# Patient Record
Sex: Male | Born: 1960 | Race: White | Hispanic: No | Marital: Single
Health system: Midwestern US, Community
[De-identification: ages and names within clinical notes are randomized; demographics above are authoritative.]

## PROBLEM LIST (undated history)

## (undated) DIAGNOSIS — F5101 Primary insomnia: Secondary | ICD-10-CM

## (undated) DIAGNOSIS — F341 Dysthymic disorder: Secondary | ICD-10-CM

## (undated) DIAGNOSIS — E538 Deficiency of other specified B group vitamins: Secondary | ICD-10-CM

## (undated) DIAGNOSIS — R972 Elevated prostate specific antigen [PSA]: Secondary | ICD-10-CM

## (undated) DIAGNOSIS — Z125 Encounter for screening for malignant neoplasm of prostate: Secondary | ICD-10-CM

---

## 2017-09-21 ENCOUNTER — Ambulatory Visit: Admit: 2017-09-21 | Discharge: 2017-09-21 | Attending: Internal Medicine | Primary: Internal Medicine

## 2017-09-21 DIAGNOSIS — F419 Anxiety disorder, unspecified: Secondary | ICD-10-CM

## 2017-09-21 MED ORDER — FLUOXETINE 10 MG CAP
10 mg | ORAL_CAPSULE | ORAL | 2 refills | Status: DC
Start: 2017-09-21 — End: 2018-01-21

## 2017-09-21 MED ORDER — VARICELLA-ZOSTER GLYCOE VACC-AS01B ADJ(PF) 50 MCG/0.5 ML IM SUSPENSION
50 mcg/0.5 mL | Freq: Once | INTRAMUSCULAR | 1 refills | Status: AC
Start: 2017-09-21 — End: 2017-09-21

## 2017-09-21 NOTE — Patient Instructions (Addendum)
Patient instructions:   Contact numbers for our care team:   medical assistants- Synetta Failnita or [facility](Jen) and secretary-Terry   [phone] Fax [phone].  Stop buspirone.  Start fluoxetine for mood - start 1 capsule daily and after 7 days increase to 2 caps daily.  Look into counseling or therapy for further assistance with your mood - consider [facility], [facility], [facility].    Limit alcohol intake to 1 drink or less for females and 2 drinks or less for males daily.  A drink is defined as 12 oz of beer, 4 oz of wine or 2 oz of liquor.  Call if symptoms are not improving and call back or go to urgent care or emergency room for worsening symptoms.         Tdap (Tetanus, Diphtheria, Pertussis) Vaccine: What You Need to Know  Why get vaccinated?  Tetanus, diphtheria, and pertussis are very serious diseases. Tdap vaccine can protect us from these diseases. And Tdap vaccine given to pregnant women can protect newborn babies against pertussis.  Tetanus (lockjaw) is rare in the [place] today. It causes painful muscle tightening and stiffness, usually all over the body.  ?? It can lead to tightening of muscles in the head and neck so you can't open your mouth, swallow, or sometimes even breathe. Tetanus kills about 1 out of 10 people who are infected even after receiving the best medical care.  Diphtheria is also rare in the [place] today. It can cause a thick coating to form in the back of the throat.  ?? It can lead to breathing problems, heart failure, paralysis, and death.  Pertussis (whooping cough) causes severe coughing spells, which can cause difficulty breathing, vomiting, and disturbed sleep.  ?? It can also lead to weight loss, incontinence, and rib fractures. Up to 2 in 100 adolescents and 5 in 100 adults with pertussis are hospitalized or have complications, which could include pneumonia or death.   These diseases are caused by bacteria. Diphtheria and pertussis are spread from person to person through secretions from coughing or sneezing. Tetanus enters the body through cuts, scratches, or wounds.  Before vaccines, as many as 200,000 cases of diphtheria, 200,000 cases of pertussis, and hundreds of cases of tetanus were reported in the [place] each year. Since vaccination began, reports of cases for tetanus and diphtheria have dropped by about 99% and for pertussis by about 80%.  Tdap vaccine  The Tdap vaccine can protect adolescents and adults from tetanus, diphtheria, and pertussis. One dose of Tdap is routinely given at age 55 or 4012. People who did not get Tdap at that age should get it as soon as possible.  Tdap is especially important for health care professionals and anyone having close contact with a baby younger than 12 months.  Pregnant women should get a dose of Tdap during every pregnancy, to protect the newborn from pertussis. Infants are most at risk for severe, life-threatening complications from pertussis.  Another vaccine, called Td, protects against tetanus and diphtheria, but not pertussis. A Td booster should be given every 10 years. Tdap may be given as one of these boosters if you have never gotten Tdap before. Tdap may also be given after a severe cut or burn to prevent tetanus infection.  Your doctor or the person giving you the vaccine can give you more information.  Tdap may safely be given at the same time as other vaccines.  Some people should not get this vaccine  ?? A person who has ever had a life-threatening allergic reaction after a previous dose of any diphtheria-, tetanus-, or pertussis-containing vaccine, OR has a severe allergy to any part of this vaccine, should not get Tdap vaccine. Tell the person giving the vaccine about any severe allergies.  ?? Anyone who had coma or long repeated seizures within 7 days after a  childhood dose of DTP or DTaP, or a previous dose of Tdap, should not get Tdap, unless a cause other than the vaccine was found. They can still get Td.  ?? Talk to your doctor if you:  ? Have seizures or another nervous system problem.  ? Had severe pain or swelling after any vaccine containing diphtheria, tetanus, or pertussis.  ? Ever had a condition called Guillain-Barr?? Syndrome (GBS).  ? Aren't feeling well on the day the shot is scheduled.  Risks  With any medicine, including vaccines, there is a chance of side effects. These are usually mild and go away on their own. Serious reactions are also possible but are rare.  Most people who get Tdap vaccine do not have any problems with it.  Mild problems following Tdap  (Did not interfere with activities)  ?? Pain where the shot was given (about 3 in 4 adolescents or 2 in 3 adults)  ?? Redness or swelling where the shot was given (about 1 person in 5)  ?? Mild fever of at least 100.4??F (up to about 1 in 25 adolescents or 1 in 100 adults)  ?? Headache (about 3 or 4 people in 10)  ?? Tiredness (about 1 person in 3 or 4)  ?? Nausea, vomiting, diarrhea, stomachache (up to 1 in 4 adolescents or 1 in 10 adults)  ?? Chills, sore joints (about 1 person in 10)  ?? Body aches (about 1 person in 3 or 4)  ?? Rash, swollen glands (uncommon)  Moderate problems following Tdap  (Interfered with activities, but did not require medical attention)  ?? Pain where the shot was given (up to 1 in 5 or 6)  ?? Redness or swelling where the shot was given (up to about 1 in 16 adolescents or 1 in 12 adults)  ?? Fever over 102??F (about 1 in 100 adolescents or 1 in 250 adults)  ?? Headache (about 1 in 7 adolescents or 1 in 10 adults)  ?? Nausea, vomiting, diarrhea, stomachache (up to 1 to 3 people in 100)  ?? Swelling of the entire arm where the shot was given (up to about 1 in 500)  Severe problems following Tdap  (Unable to perform usual activities; required medical attention)   ?? Swelling, severe pain, bleeding and redness in the arm where the shot was given (rare)  Problems that could happen after any vaccine:  ?? People sometimes faint after a medical procedure, including vaccination. Sitting or lying down for about 15 minutes can help prevent fainting, and injuries caused by a fall. Tell your doctor if you feel dizzy or have vision changes or ringing in the ears.  ?? Some people get severe pain in the shoulder and have difficulty moving the arm where a shot was given. This happens very rarely.  ?? Any medication can cause a severe allergic reaction. Such reactions from a vaccine are very rare, estimated at fewer than 1 in a million doses, and would happen within a few minutes to a few hours after the vaccination.  As with any medicine, there  is a very remote chance of a vaccine causing a serious injury or death.  The safety of vaccines is always being monitored. For more information, visit: CreditChaos.com.ee.  What if there is a serious problem?  What should I look for?  ?? Look for anything that concerns you, such as signs of a severe allergic reaction, very high fever, or unusual behavior.  Signs of a severe allergic reaction can include hives, swelling of the face and throat, difficulty breathing, a fast heartbeat, dizziness, and weakness. These would usually start a few minutes to a few hours after the vaccination.  What should I do?  ?? If you think it is a severe allergic reaction or other emergency that can't wait, call 9-1-1 or get the person to the nearest hospital. Otherwise, call your doctor.  ?? Afterward, the reaction should be reported to the Vaccine Adverse Event Reporting System (VAERS). Your doctor might file this report, or you can do it yourself through the VAERS web site at www.vaers.LAgents.no, or by calling 1-586-685-4061.  VAERS does not give medical advice.  The National Vaccine Injury Compensation Program   The National Vaccine Injury Compensation Program (VICP) is a federal program that was created to compensate people who may have been injured by certain vaccines.  Persons who believe they may have been injured by a vaccine can learn about the program and about filing a claim by calling 1-251 845 1967 or visiting the VICP website at SpiritualWord.at. There is a time limit to file a claim for compensation.  How can I learn more?  ?? Ask your doctor. He or she can give you the vaccine package insert or suggest other sources of information.  ?? Call your local or state health department.  ?? Contact the Centers for Disease Control and Prevention (CDC):  ? Call (905)854-1860 (1-800-CDC-INFO) or  ? Visit CDC's website at PicCapture.uy  Vaccine Information Statement (Interim)  Tdap Vaccine  (06/03/13)  42 [place]. ?? [phone]  Department of Health and Insurance risk surveyor for Disease Control and Prevention  Many Vaccine Information Statements are available in Spanish and other languages. See PromoAge.com.br.  Muchas hojas de informaci??n sobre vacunas est??n disponibles en espa??ol y en otros idiomas. Visite PromoAge.com.br.  Care instructions adapted under license by [employer] (which disclaims liability or warranty for this information). If you have questions about a medical condition or this instruction, always ask your healthcare professional. Healthwise[employer] Incorporated disclaims any warranty or liability for your use of this information.
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
## 2017-10-22 NOTE — Telephone Encounter (Signed)
Spoke with pt to check and see how he is doing since his recent OV.    Asked pt how his mood has been,  pt states he is in a good mood. Pt states that he didn't need the fluoxetine after all he had taken it for a week and stopped, he is doing good without it. Pt states that he also did not want to take any drugs.    Asked pt if he found a therapist for counseling pt states that he called a couple of people and talked to them but he didn't go because he has gotten his head clear through exercise/working out.    Asked pt if he has been able to reduce his alcohol intake to help with mood and reduce risk to his liver and BP. Pt states that he has reduce alcohol.    Informed pt to let us know if he needs anything. Pt states he will be in touch.

## 2017-10-22 NOTE — Telephone Encounter (Signed)
Please call to check and see how patient is doing since his recent office visit.  Specifically ask the following:    1. How is his mood lately? Improved, same, worsened?  2. Is he tolerating fluoxetine well? Any refills needed?  3. Has he found a therapist for counseling?  4. Has he been able to reduce his alcohol intake to help with mood and reduce risk to his liver and BP?      Future Appointments   Date Time Provider Department Center   01/11/2018 10:40 AM Fredrik RiggerMcFarland, Timera Windt, MD [facility]

## 2017-12-24 MED ORDER — BUSPIRONE 15 MG TAB
15 mg | ORAL_TABLET | Freq: Two times a day (BID) | ORAL | 1 refills | Status: DC
Start: 2017-12-24 — End: 2019-06-26

## 2017-12-24 NOTE — Telephone Encounter (Signed)
Discussed request with the pt.  Pt did not like the way Prozac made him feel.  Pt has been exercising again and has his head on straight.  Pt prefers to go back to previous medication buspirone 15 mg.

## 2017-12-31 NOTE — Telephone Encounter (Signed)
I am happy to refer pt for screening colonoscopy if he would like. Is this what he is requesting? If so referral will be sent but still recommend f/u to discuss his other medical problems/follow up.

## 2017-12-31 NOTE — Telephone Encounter (Signed)
Pt walked in to the office. Pt would like a order for a screening colonoscopy. Also let pt know he has an appointment next week on Friday. Pt can be reached at [phone] if any questions.

## 2017-12-31 NOTE — Telephone Encounter (Signed)
Pt has an appt scheduled for 01/11/18.     Is requesting screening for colonoscopy,

## 2018-01-01 NOTE — Telephone Encounter (Signed)
Pt called back to confirm his appt for 01/11/18

## 2018-01-01 NOTE — Telephone Encounter (Signed)
LVM for pt to return call

## 2018-01-11 ENCOUNTER — Encounter: Attending: Internal Medicine | Primary: Internal Medicine

## 2018-01-21 ENCOUNTER — Ambulatory Visit: Admit: 2018-01-21 | Discharge: 2018-01-21 | Attending: Internal Medicine | Primary: Internal Medicine

## 2018-01-21 ENCOUNTER — Ambulatory Visit: Attending: Internal Medicine | Primary: Internal Medicine

## 2018-01-21 DIAGNOSIS — F419 Anxiety disorder, unspecified: Secondary | ICD-10-CM

## 2018-01-21 MED ORDER — SERTRALINE 50 MG TAB
50 mg | ORAL_TABLET | Freq: Every day | ORAL | 1 refills | Status: DC
Start: 2018-01-21 — End: 2018-01-31
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
## 2018-01-21 NOTE — Patient Instructions (Signed)
Patient instructions:   Contact numbers for our care team:   medical assistants- Phyllis Ginger or Jen  [phone] Fax [phone].  Start 1/2 tab of sertraline daily for mood and scratching habits. If improving consider reducing buspirone dosage if possible.  Call if symptoms are not improving and call back or go to urgent care or emergency room for worsening symptoms.
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
## 2018-01-31 MED ORDER — DOXEPIN 10 MG CAP
10 mg | ORAL_CAPSULE | ORAL | 3 refills | Status: DC
Start: 2018-01-31 — End: 2019-06-05

## 2018-01-31 NOTE — Telephone Encounter (Signed)
LVM for pt to return call

## 2018-01-31 NOTE — Telephone Encounter (Signed)
Ok if pt is doing well without medications for his mood this is good, then ok to use only something for sleep. I recommend doxepin low dosage for sleep. Most common side effect is sleepiness, others include possible dry mouth, constipation, dizziness but are not likely at low dosage used for sleep.  Rx sent to his pharmacy.      Please also remind patient that alcohol use is known to also to cause sleep disturbance.

## 2018-01-31 NOTE — Telephone Encounter (Signed)
Pt walked in to the office. Pt would like to "return a prescription" for sertraline (Zoloft) - pt is no longer taking it as it caused horrible diarrhea. The prescription also did not help the patient sleep. Pt had to pay $550.00 for a cologuard test that he cannot use until he is back to regular. Pt would like to know if there is a different medication that he can try instead of the sertraline. Pt is wondering if Alfonso Patten can be prescribed. Please call the pt at [phone].

## 2018-01-31 NOTE — Telephone Encounter (Signed)
Pt has been advised that we unfortunately cannot do anything about the Rx for sertraline.  We can try something else but pt declined.  He is happy with life right now.  He is just wanting to help sleep.

## 2018-02-05 NOTE — Telephone Encounter (Signed)
Pt states that the doxepin that was sent to his pharmacy on 01/31/18 is working great.     Pt was reminded that alcohol use is known to also to cause sleep disturbance.    Pt expressed understanding.

## 2018-02-19 ENCOUNTER — Encounter: Primary: Internal Medicine

## 2018-07-07 LAB — FECAL DNA COLORECTAL CANCER SCREENING (COLOGUARD): FIT-DNA (Cologuard): NEGATIVE

## 2019-06-05 ENCOUNTER — Ambulatory Visit: Admit: 2019-06-05 | Discharge: 2019-06-05 | Attending: Internal Medicine | Primary: Internal Medicine

## 2019-06-05 ENCOUNTER — Ambulatory Visit: Attending: Internal Medicine | Primary: Internal Medicine

## 2019-06-05 DIAGNOSIS — F5101 Primary insomnia: Secondary | ICD-10-CM

## 2019-06-05 MED ORDER — TRAZODONE 50 MG TAB
50 mg | ORAL_TABLET | Freq: Every evening | ORAL | 1 refills | Status: DC
Start: 2019-06-05 — End: 2019-06-26

## 2019-06-05 MED ORDER — SHINGRIX (PF) 50 MCG/0.5 ML INTRAMUSCULAR SUSPENSION, KIT
50 mcg/0.5 mL | Freq: Once | INTRAMUSCULAR | 1 refills | Status: AC
Start: 2019-06-05 — End: 2019-06-05

## 2019-06-05 MED ORDER — ESCITALOPRAM 5 MG TAB
5 mg | ORAL_TABLET | Freq: Every day | ORAL | 1 refills | Status: DC
Start: 2019-06-05 — End: 2019-06-26

## 2019-06-05 NOTE — Progress Notes (Signed)
I have reviewed all lab results which are normal or stable except PSA is quite elevated so recommend he take levaquin 500 mg daily for 14 days and then repeat PSA . Please inform the patient.

## 2019-06-05 NOTE — Progress Notes (Signed)
I called and notified the pt of these results/recommendations.  The pt verbalized understanding.  The pt would like to have the antibx sent to [facility] on [address]. He will call back once he has completed the antibx to schedule a lab appt.

## 2019-06-05 NOTE — Progress Notes (Signed)
[facility]  [facility]  Phillip Steele, M.D.  Internal Medicine  [address] [place] [address]  Office : [phone]  Fax : [phone]    Chief Complaint   Patient presents with   ??? Anxiety     This pt is a transfer from Dr. McFarland and is in for a ROV.  The pt states he would like to see about getting labs and to discuss some stress related issues.  He states he was given Doxepin, but it gives him weird dreams and makes him sweat.  The pt is not taking Buspar, but states he needs something for anxiety.        History of Present Illness:  Phillip Steele is a 59 y.o. male.  Anxiety  The history is provided by the patient. This is a chronic problem. The current episode started more than 1 week ago. The problem occurs constantly. The problem has not changed since onset.Pertinent negatives include no chest pain and no shortness of breath. The symptoms are aggravated by stress. Nothing relieves the symptoms. He has tried nothing for the symptoms. The treatment provided no relief.           Past Medical History:  Past Medical History:   Diagnosis Date   ??? Alcohol use 09/21/2017   ??? Anxiety 09/21/2017     Past Surgical History:  Past Surgical History:   Procedure Laterality Date   ??? HX CYST REMOVAL  ?2000    upper back     Allergies:   No Known Allergies  Medications:   Current Outpatient Medications   Medication Sig   ??? doxepin (SINEQUAN) 10 mg capsule Take 1 or 2 caps at bedtime for sleep (ideally 1 hour prior to bed)   ??? busPIRone (BUSPAR) 15 mg tablet Take 1 Tab by mouth two (2) times a day.     No current facility-administered medications for this visit.      Social History:  Social History     Tobacco Use   ??? Smoking status: Never Smoker   ??? Smokeless tobacco: Never Used   Substance Use Topics   ??? Alcohol use: Yes     Alcohol/week: 7.0 - 14.0 standard drinks     Types: 7 - 14 Glasses of wine per week     Frequency: 4 or more times a week     Drinks per session: 1 or 2      Comment: a week     Family History  Family History   Problem Relation Age of Onset   ??? Dementia Mother    ??? Other Father         unknown   ??? Thyroid Disease Sister        Review of Systems   Constitutional: Negative for chills and fever.   Respiratory: Negative for shortness of breath.    Cardiovascular: Negative for chest pain.   Musculoskeletal: Negative for falls.   Neurological: Negative for loss of consciousness.   Psychiatric/Behavioral: The patient is nervous/anxious and has insomnia.        Vital Signs  Visit Vitals  BP 137/88 (BP 1 Location: Right upper arm, BP Patient Position: Sitting, BP Cuff Size: Large adult)   Pulse 67   Temp 98.4 ??F (36.9 ??C) (Temporal)   Ht 5' 10" (1.778 m)   Wt 210 lb 12.8 oz (95.6 kg)   SpO2 99%   BMI 30.25 kg/m??       Body mass index is 30.25 kg/m??.    Physical Exam  Vitals signs reviewed.   Constitutional:       General: He is not in acute distress.     Appearance: Normal appearance. He is not ill-appearing.   HENT:      Head: Normocephalic and atraumatic.   Eyes:      General: No scleral icterus.     Extraocular Movements: Extraocular movements intact.      Pupils: Pupils are equal, round, and reactive to light.   Neck:      Vascular: No carotid bruit.   Cardiovascular:      Rate and Rhythm: Normal rate and regular rhythm.      Heart sounds: Normal heart sounds. No murmur.   Pulmonary:      Effort: Pulmonary effort is normal.      Breath sounds: Normal breath sounds.   Musculoskeletal:         General: No swelling.   Lymphadenopathy:      Cervical: No cervical adenopathy.   Skin:     Coloration: Skin is not jaundiced.   Neurological:      General: No focal deficit present.      Mental Status: He is alert. Mental status is at baseline.      Cranial Nerves: No cranial nerve deficit.      Motor: No weakness.      Gait: Gait normal.   Psychiatric:         Mood and Affect: Mood normal.         Behavior: Behavior normal.         Thought Content: Thought content normal.          Judgment: Judgment normal.           Assessment/Plan:    ICD-10-CM ICD-9-CM    1. Primary insomnia  F51.01 307.42 traZODone (DESYREL) 50 mg tablet   2. Anxiety  F41.9 300.00 escitalopram oxalate (LEXAPRO) 5 mg tablet      CBC WITH AUTOMATED DIFF      METABOLIC PANEL, BASIC   3. Screening for heart disease  Z13.6 V81.2 CT HEART W/O CONT WITH CALCIUM   4. Abnormal liver function  R94.5 794.8 LIPID PANEL      HEPATIC FUNCTION PANEL   5. Screening for prostate cancer  Z12.5 V76.44 PSA, DIAGNOSTIC (PROSTATE SPECIFIC AG)   6. Encounter for immunization  Z23 V03.89 varicella-zoster recombinant, PF, (Shingrix, PF,) 50 mcg/0.5 mL susr injection     Encounter Diagnoses   Name Primary?   ??? Primary insomnia Yes   ??? Anxiety    ??? Screening for heart disease    ??? Abnormal liver function    ??? Screening for prostate cancer    ??? Encounter for immunization      Diagnoses and all orders for this visit:    1. Primary insomnia  -     traZODone (DESYREL) 50 mg tablet; Take 1 Tab by mouth nightly.    2. Anxiety  -     escitalopram oxalate (LEXAPRO) 5 mg tablet; Take 1 Tab by mouth daily.  -     CBC WITH AUTOMATED DIFF  -     METABOLIC PANEL, BASIC    3. Screening for heart disease  -     CT HEART W/O CONT WITH CALCIUM; Future    4. Abnormal liver function  -     LIPID PANEL  -     HEPATIC FUNCTION PANEL  5. Screening for prostate cancer  -     PSA, DIAGNOSTIC (PROSTATE SPECIFIC AG)    6. Encounter for immunization  -     varicella-zoster recombinant, PF, (Shingrix, PF,) 50 mcg/0.5 mL susr injection; 0.5 mL by IntraMUSCular route once for 1 dose.      Follow-up and Dispositions    ?? Return in about 6 months (around 12/03/2019), or if symptoms worsen or fail to improve.           __  Margrett Rud, M.D.

## 2019-06-05 NOTE — Progress Notes (Signed)
[facility]  [facility]  Danelle Earthly, M.D.  Internal Medicine  [address]  [place], [place] [address]  Office : [phone]  Fax : [phone]    Chief Complaint   Patient presents with   ??? Anxiety     This pt is a transfer from Dr. Harriette Bouillon and is in for a ROV.  The pt states he would like to see about getting labs and to discuss some stress related issues.  He states he was given Doxepin, but it gives him weird dreams and makes him sweat.  The pt is not taking Buspar, but states he needs something for anxiety.        History of Present Illness:  Phillip Steele is a 59 y.o. male.  Anxiety  The history is provided by the patient. This is a chronic problem. The current episode started more than 1 week ago. The problem occurs constantly. The problem has not changed since onset.Pertinent negatives include no chest pain and no shortness of breath. The symptoms are aggravated by stress. Nothing relieves the symptoms. He has tried nothing for the symptoms. The treatment provided no relief.           Past Medical History:  Past Medical History:   Diagnosis Date   ??? Alcohol use 09/21/2017   ??? Anxiety 09/21/2017     Past Surgical History:  Past Surgical History:   Procedure Laterality Date   ??? HX CYST REMOVAL  ?2000    upper back     Allergies:   No Known Allergies  Medications:   Current Outpatient Medications   Medication Sig   ??? doxepin (SINEQUAN) 10 mg capsule Take 1 or 2 caps at bedtime for sleep (ideally 1 hour prior to bed)   ??? busPIRone (BUSPAR) 15 mg tablet Take 1 Tab by mouth two (2) times a day.     No current facility-administered medications for this visit.      Social History:  Social History     Tobacco Use   ??? Smoking status: Never Smoker   ??? Smokeless tobacco: Never Used   Substance Use Topics   ??? Alcohol use: Yes     Alcohol/week: 7.0 - 14.0 standard drinks     Types: 7 - 14 Glasses of wine per week     Frequency: 4 or more times a week     Drinks per session: 1 or 2      Comment: a week     Family History  Family History   Problem Relation Age of Onset   ??? Dementia Mother    ??? Other Father         unknown   ??? Thyroid Disease Sister        Review of Systems   Constitutional: Negative for chills and fever.   Respiratory: Negative for shortness of breath.    Cardiovascular: Negative for chest pain.   Musculoskeletal: Negative for falls.   Neurological: Negative for loss of consciousness.   Psychiatric/Behavioral: The patient is nervous/anxious and has insomnia.        Vital Signs  Visit Vitals  BP 137/88 (BP 1 Location: Right upper arm, BP Patient Position: Sitting, BP Cuff Size: Large adult)   Pulse 67   Temp 98.4 ??F (36.9 ??C) (Temporal)   Ht 5\' 10"  (1.778 m)   Wt 210 lb 12.8 oz (95.6 kg)   SpO2 99%   BMI 30.25 kg/m??  Body mass index is 30.25 kg/m??.    Physical Exam  Vitals signs reviewed.   Constitutional:       General: He is not in acute distress.     Appearance: Normal appearance. He is not ill-appearing.   HENT:      Head: Normocephalic and atraumatic.   Eyes:      General: No scleral icterus.     Extraocular Movements: Extraocular movements intact.      Pupils: Pupils are equal, round, and reactive to light.   Neck:      Vascular: No carotid bruit.   Cardiovascular:      Rate and Rhythm: Normal rate and regular rhythm.      Heart sounds: Normal heart sounds. No murmur.   Pulmonary:      Effort: Pulmonary effort is normal.      Breath sounds: Normal breath sounds.   Musculoskeletal:         General: No swelling.   Lymphadenopathy:      Cervical: No cervical adenopathy.   Skin:     Coloration: Skin is not jaundiced.   Neurological:      General: No focal deficit present.      Mental Status: He is alert. Mental status is at baseline.      Cranial Nerves: No cranial nerve deficit.      Motor: No weakness.      Gait: Gait normal.   Psychiatric:         Mood and Affect: Mood normal.         Behavior: Behavior normal.         Thought Content: Thought content normal.          Judgment: Judgment normal.           Assessment/Plan:    ICD-10-CM ICD-9-CM    1. Primary insomnia  F51.01 307.42 traZODone (DESYREL) 50 mg tablet   2. Anxiety  F41.9 300.00 escitalopram oxalate (LEXAPRO) 5 mg tablet      CBC WITH AUTOMATED DIFF      METABOLIC PANEL, BASIC   3. Screening for heart disease  Z13.6 V81.2 CT HEART W/O CONT WITH CALCIUM   4. Abnormal liver function  R94.5 794.8 LIPID PANEL      HEPATIC FUNCTION PANEL   5. Screening for prostate cancer  Z12.5 V76.44 PSA, DIAGNOSTIC (PROSTATE SPECIFIC AG)   6. Encounter for immunization  Z23 V03.89 varicella-zoster recombinant, PF, (Shingrix, PF,) 50 mcg/0.5 mL susr injection     Encounter Diagnoses   Name Primary?   ??? Primary insomnia Yes   ??? Anxiety    ??? Screening for heart disease    ??? Abnormal liver function    ??? Screening for prostate cancer    ??? Encounter for immunization      Diagnoses and all orders for this visit:    1. Primary insomnia  -     traZODone (DESYREL) 50 mg tablet; Take 1 Tab by mouth nightly.    2. Anxiety  -     escitalopram oxalate (LEXAPRO) 5 mg tablet; Take 1 Tab by mouth daily.  -     CBC WITH AUTOMATED DIFF  -     METABOLIC PANEL, BASIC    3. Screening for heart disease  -     CT HEART W/O CONT WITH CALCIUM; Future    4. Abnormal liver function  -     LIPID PANEL  -     HEPATIC FUNCTION PANEL  5. Screening for prostate cancer  -     PSA, DIAGNOSTIC (PROSTATE SPECIFIC AG)    6. Encounter for immunization  -     varicella-zoster recombinant, PF, (Shingrix, PF,) 50 mcg/0.5 mL susr injection; 0.5 mL by IntraMUSCular route once for 1 dose.      Follow-up and Dispositions    ?? Return in about 6 months (around 12/03/2019), or if symptoms worsen or fail to improve.           __  Margrett Rud, M.D.

## 2019-06-05 NOTE — Progress Notes (Signed)
I have reviewed all lab results which are normal or stable except PSA is quite elevated so recommend he take levaquin 500 mg daily for 14 days and then repeat PSA . Please inform the patient.

## 2019-06-05 NOTE — Progress Notes (Signed)
I called and notified the pt of these results/recommendations.  The pt verbalized understanding.  The pt would like to have the antibx sent to [facility] on [address]. He will call back once he has completed the antibx to schedule a lab appt.

## 2019-06-06 LAB — LIPID PANEL
Cholesterol, Total: 164 mg/dL (ref 100–199)
Cholesterol, total: 164 mg/dL (ref 100–199)
HDL Cholesterol: 56 mg/dL (ref >39)
HDL: 56 mg/dL (ref >39)
LDL Calculated: 95 mg/dL (ref 0–99)
LDL, calculated: 95 mg/dL (ref 0–99)
Triglyceride: 65 mg/dL (ref 0–149)
Triglycerides: 65 mg/dL (ref 0–149)
VLDL, calculated: 13 mg/dL (ref 5–40)
VLDL: 13 mg/dL (ref 5–40)

## 2019-06-06 LAB — HEPATIC FUNCTION PANEL
ALT (SGPT): 22 IU/L (ref 0–44)
ALT: 22 IU/L (ref 0–44)
AST (SGOT): 25 IU/L (ref 0–40)
AST: 25 IU/L (ref 0–40)
Albumin: 4.6 g/dL (ref 3.8–4.9)
Albumin: 4.6 g/dL (ref 3.8–4.9)
Alk. phosphatase: 58 IU/L (ref 39–117)
Alkaline Phosphatase: 58 IU/L (ref 39–117)
Bilirubin, Direct: 0.15 mg/dL (ref 0.00–0.40)
Bilirubin, direct: 0.15 mg/dL (ref 0.00–0.40)
Bilirubin, total: 0.5 mg/dL (ref 0.0–1.2)
Protein, total: 6.9 g/dL (ref 6.0–8.5)
Total Bilirubin: 0.5 mg/dL (ref 0.0–1.2)
Total Protein: 6.9 g/dL (ref 6.0–8.5)

## 2019-06-06 LAB — BASIC METABOLIC PANEL
BUN: 22 mg/dL (ref 6–24)
Bun/Cre Ratio: 22 NA — ABNORMAL HIGH (ref 9–20)
CO2: 23 mmol/L (ref 20–29)
Calcium: 9.5 mg/dL (ref 8.7–10.2)
Chloride: 104 mmol/L (ref 96–106)
Creatinine: 1 mg/dL (ref 0.76–1.27)
EGFR IF NonAfrican American: 83 mL/min/{1.73_m2} (ref >59)
GFR African American: 96 mL/min/{1.73_m2} (ref >59)
Glucose: 89 mg/dL (ref 65–99)
Potassium: 4.5 mmol/L (ref 3.5–5.2)
Sodium: 141 mmol/L (ref 134–144)

## 2019-06-06 LAB — CBC WITH AUTO DIFFERENTIAL
Basophils %: 2 %
Basophils Absolute: 0.1 10*3/uL (ref 0.0–0.2)
Eosinophils %: 2 %
Eosinophils Absolute: 0.1 10*3/uL (ref 0.0–0.4)
Granulocyte Absolute Count: 0 10*3/uL (ref 0.0–0.1)
Hematocrit: 43.1 % (ref 37.5–51.0)
Hemoglobin: 15.2 g/dL (ref 13.0–17.7)
Immature Granulocytes: 0 %
Lymphocytes %: 40 %
Lymphocytes Absolute: 2.2 10*3/uL (ref 0.7–3.1)
MCH: 33.1 pg — ABNORMAL HIGH (ref 26.6–33.0)
MCHC: 35.3 g/dL (ref 31.5–35.7)
MCV: 94 fL (ref 79–97)
Monocytes %: 14 %
Monocytes Absolute: 0.8 10*3/uL (ref 0.1–0.9)
Neutrophils %: 42 %
Neutrophils Absolute: 2.3 10*3/uL (ref 1.4–7.0)
Platelets: 263 10*3/uL (ref 150–450)
RBC: 4.59 x10E6/uL (ref 4.14–5.80)
RDW: 12.5 % (ref 11.6–15.4)
WBC: 5.4 10*3/uL (ref 3.4–10.8)

## 2019-06-06 LAB — PSA PROSTATIC SPECIFIC ANTIGEN: PSA: 10.8 ng/mL — ABNORMAL HIGH (ref 0.0–4.0)

## 2019-06-06 LAB — CBC WITH AUTOMATED DIFF
ABS. BASOPHILS: 0.1 10*3/uL (ref 0.0–0.2)
ABS. EOSINOPHILS: 0.1 10*3/uL (ref 0.0–0.4)
ABS. IMM. GRANS.: 0 10*3/uL (ref 0.0–0.1)
ABS. MONOCYTES: 0.8 10*3/uL (ref 0.1–0.9)
ABS. NEUTROPHILS: 2.3 10*3/uL (ref 1.4–7.0)
Abs Lymphocytes: 2.2 10*3/uL (ref 0.7–3.1)
BASOPHILS: 2 %
EOSINOPHILS: 2 %
HCT: 43.1 % (ref 37.5–51.0)
HGB: 15.2 g/dL (ref 13.0–17.7)
IMMATURE GRANULOCYTES: 0 %
Lymphocytes: 40 %
MCH: 33.1 pg — ABNORMAL HIGH (ref 26.6–33.0)
MCHC: 35.3 g/dL (ref 31.5–35.7)
MCV: 94 fL (ref 79–97)
MONOCYTES: 14 %
NEUTROPHILS: 42 %
PLATELET: 263 10*3/uL (ref 150–450)
RBC: 4.59 x10E6/uL (ref 4.14–5.80)
RDW: 12.5 % (ref 11.6–15.4)
WBC: 5.4 10*3/uL (ref 3.4–10.8)

## 2019-06-06 LAB — METABOLIC PANEL, BASIC
BUN/Creatinine ratio: 22 — ABNORMAL HIGH (ref 9–20)
BUN: 22 mg/dL (ref 6–24)
CO2: 23 mmol/L (ref 20–29)
Calcium: 9.5 mg/dL (ref 8.7–10.2)
Chloride: 104 mmol/L (ref 96–106)
Creatinine: 1 mg/dL (ref 0.76–1.27)
GFR est AA: 96 mL/min/{1.73_m2} (ref >59)
GFR est non-AA: 83 mL/min/{1.73_m2} (ref >59)
Glucose: 89 mg/dL (ref 65–99)
Potassium: 4.5 mmol/L (ref 3.5–5.2)
Sodium: 141 mmol/L (ref 134–144)

## 2019-06-06 LAB — PSA, DIAGNOSTIC (PROSTATE SPECIFIC AG): Prostate Specific Ag: 10.8 ng/mL — ABNORMAL HIGH (ref 0.0–4.0)

## 2019-06-10 MED ORDER — LEVOFLOXACIN 500 MG TAB
500 mg | ORAL_TABLET | Freq: Every day | ORAL | 0 refills | Status: DC
Start: 2019-06-10 — End: 2019-06-26

## 2019-06-10 NOTE — Telephone Encounter (Addendum)
-----   Message from Margrett Rud, MD sent at 06/10/2019  7:39 AM EST -----  I have reviewed all lab results which are normal or stable except PSA is quite elevated so recommend he take levaquin 500 mg daily for 14 days and then repeat PSA . Please inform the patient.    I called and notified the pt of these results/recommendations.  The pt verbalized understanding.  The pt would like to have the antibx sent to [facility] on [address]. He will call back once he has completed the antibx to schedule a lab appt.

## 2019-06-12 ENCOUNTER — Ambulatory Visit: Primary: Internal Medicine

## 2019-06-23 NOTE — Telephone Encounter (Signed)
The pt called stating he needs to set-up an appt for f/u after being placed on an antibx.  CB# [phone]    I called the pt and scheduled him for 06/26/19 at 1330.

## 2019-06-26 ENCOUNTER — Ambulatory Visit: Admit: 2019-06-26 | Discharge: 2019-06-26 | Attending: Internal Medicine | Primary: Internal Medicine

## 2019-06-26 ENCOUNTER — Inpatient Hospital Stay: Admit: 2019-06-26 | Payer: Self-pay | Attending: Internal Medicine | Primary: Internal Medicine

## 2019-06-26 ENCOUNTER — Ambulatory Visit: Attending: Internal Medicine | Primary: Internal Medicine

## 2019-06-26 DIAGNOSIS — Z136 Encounter for screening for cardiovascular disorders: Secondary | ICD-10-CM

## 2019-06-26 DIAGNOSIS — R972 Elevated prostate specific antigen [PSA]: Secondary | ICD-10-CM

## 2019-06-26 MED ORDER — TRAZODONE 50 MG TAB
50 mg | ORAL_TABLET | Freq: Every evening | ORAL | 1 refills | Status: AC
Start: 2019-06-26 — End: ?

## 2019-06-26 NOTE — Progress Notes (Signed)
The pt has an appt today at 1330.  We will discuss at his appt.

## 2019-06-26 NOTE — Progress Notes (Signed)
I called and notified the pt of these results/recommendations.  The pt verbalized understanding.

## 2019-06-26 NOTE — Progress Notes (Signed)
PSA has dropped significantly after antibiotic treatment.  Will recheck at his next visit to see if it continues to improve.  Please inform the patient

## 2019-06-26 NOTE — Progress Notes (Signed)
CT Calcium score report reviewed and is 0 which is normal.   Repeat in 5 years. Please inform the patient

## 2019-06-26 NOTE — Progress Notes (Signed)
[facility]  [facility]  Danelle Earthly, M.D.  Internal Medicine  [address]  [place], [place] [address]  Office : [phone]  Fax : [phone]    Chief Complaint   Patient presents with   ??? Abnormal Lab Results     The pt is in to f/u after an abnormal PSA.  He has now completed an antibx for this.  He is also in to go over his CT Calcium Score results.  The pt is no longer taking Lexpro, due to horrible side effects, and he is no longer on Buspar.  He would like these removed from his list.        History of Present Illness:  Phillip Steele is a 59 y.o. male.  HPI    Elevated PSA  Treated with levaquin for possible pro statis and he needs a follow up PSA.  He denies any trouble urinating or groin pain.  He did have decreased libido which has improved.  He attributes it to lexapro, but he also quit drinking alcohol    Insomnia  Trazodone helps with sleep      Pulmonary nodules  One is calcified.  The other is 2 mm and he never smoked or was exposed to tobacco, asbestos, or coal dust.  He is low risk for lung cancer so no follow up recommended per Fleischner society guidelines  CT Calcium score = 0.      Past Medical History:  Past Medical History:   Diagnosis Date   ??? Alcohol use 09/21/2017   ??? Anxiety 09/21/2017     Past Surgical History:  Past Surgical History:   Procedure Laterality Date   ??? HX CYST REMOVAL  ?2000    upper back     Allergies:   Allergies   Allergen Reactions   ??? Lexapro [Escitalopram] Other (comments)     SIDE EFFECTS     Medications:   Current Outpatient Medications   Medication Sig   ??? traZODone (DESYREL) 50 mg tablet Take 1 Tab by mouth nightly.     No current facility-administered medications for this visit.      Social History:  Social History     Tobacco Use   ??? Smoking status: Never Smoker   ??? Smokeless tobacco: Never Used   Substance Use Topics   ??? Alcohol use: Not Currently     Family History  Family History   Problem Relation Age of Onset   ??? Dementia  Mother    ??? Other Father         unknown   ??? Thyroid Disease Sister        Review of Systems   Constitutional: Negative for chills and fever.   Genitourinary: Negative for dysuria, frequency and urgency.   Psychiatric/Behavioral: The patient has insomnia.        Vital Signs  Visit Vitals  BP 122/80 (BP 1 Location: Right upper arm, BP Patient Position: Sitting, BP Cuff Size: Large adult)   Pulse 63   Temp 97.3 ??F (36.3 ??C) (Temporal)   Ht 5\' 10"  (1.778 m)   Wt 208 lb 3.2 oz (94.4 kg)   SpO2 98%   BMI 29.87 kg/m??     Body mass index is 29.87 kg/m??.    Physical Exam  Vitals signs reviewed.   Constitutional:       General: He is not in acute distress.     Appearance: Normal appearance. He is not ill-appearing.  HENT:      Head: Normocephalic and atraumatic.   Pulmonary:      Effort: Pulmonary effort is normal.   Genitourinary:     Prostate: Normal.      Rectum: Normal.   Musculoskeletal:         General: No swelling.   Skin:     Coloration: Skin is not jaundiced.      Findings: No rash.   Neurological:      General: No focal deficit present.      Mental Status: He is alert. Mental status is at baseline.      Cranial Nerves: No cranial nerve deficit.      Motor: No weakness.      Gait: Gait normal.   Psychiatric:         Mood and Affect: Mood normal.         Behavior: Behavior normal.         Thought Content: Thought content normal.         Judgment: Judgment normal.       Lab Results   Component Value Date/Time    Prostate Specific Ag 10.8 (H) 06/05/2019 04:02 PM         Assessment/Plan:    ICD-10-CM ICD-9-CM    1. Elevated PSA  R97.20 790.93 PSA, DIAGNOSTIC (PROSTATE SPECIFIC AG)   2. Primary insomnia  F51.01 307.42 traZODone (DESYREL) 50 mg tablet     Encounter Diagnoses   Name Primary?   ??? Elevated PSA Yes   ??? Primary insomnia      Diagnoses and all orders for this visit:    1. Elevated PSA  -     PSA, DIAGNOSTIC (PROSTATE SPECIFIC AG)    2. Primary insomnia  -     traZODone (DESYREL) 50 mg tablet; Take 1 Tab by  mouth nightly.      Follow-up and Dispositions    ?? Return if symptoms worsen or fail to improve.           __  Shinita Mac R. Veta Dambrosia, M.D.

## 2019-06-26 NOTE — Progress Notes (Signed)
CT Calcium score report reviewed and is 0 which is normal.   Repeat in 5 years. Please inform the patient.    Patient has been informed and voiced understanding.

## 2019-06-26 NOTE — Progress Notes (Signed)
[facility]  [facility]  Danelle Earthly, M.D.  Internal Medicine  [address]  [place], [place] [address]  Office : [phone]  Fax : [phone]    Chief Complaint   Patient presents with   ??? Abnormal Lab Results     The pt is in to f/u after an abnormal PSA.  He has now completed an antibx for this.  He is also in to go over his CT Calcium Score results.  The pt is no longer taking Lexpro, due to horrible side effects, and he is no longer on Buspar.  He would like these removed from his list.        History of Present Illness:  Phillip Steele is a 59 y.o. male.  HPI    Elevated PSA  Treated with levaquin for possible pro statis and he needs a follow up PSA.  He denies any trouble urinating or groin pain.  He did have decreased libido which has improved.  He attributes it to lexapro, but he also quit drinking alcohol    Insomnia  Trazodone helps with sleep      Pulmonary nodules  One is calcified.  The other is 2 mm and he never smoked or was exposed to tobacco, asbestos, or coal dust.  He is low risk for lung cancer so no follow up recommended per Fleischner society guidelines  CT Calcium score = 0.      Past Medical History:  Past Medical History:   Diagnosis Date   ??? Alcohol use 09/21/2017   ??? Anxiety 09/21/2017     Past Surgical History:  Past Surgical History:   Procedure Laterality Date   ??? HX CYST REMOVAL  ?2000    upper back     Allergies:   Allergies   Allergen Reactions   ??? Lexapro [Escitalopram] Other (comments)     SIDE EFFECTS     Medications:   Current Outpatient Medications   Medication Sig   ??? traZODone (DESYREL) 50 mg tablet Take 1 Tab by mouth nightly.     No current facility-administered medications for this visit.      Social History:  Social History     Tobacco Use   ??? Smoking status: Never Smoker   ??? Smokeless tobacco: Never Used   Substance Use Topics   ??? Alcohol use: Not Currently     Family History  Family History   Problem Relation Age of Onset   ??? Dementia  Mother    ??? Other Father         unknown   ??? Thyroid Disease Sister        Review of Systems   Constitutional: Negative for chills and fever.   Genitourinary: Negative for dysuria, frequency and urgency.   Psychiatric/Behavioral: The patient has insomnia.        Vital Signs  Visit Vitals  BP 122/80 (BP 1 Location: Right upper arm, BP Patient Position: Sitting, BP Cuff Size: Large adult)   Pulse 63   Temp 97.3 ??F (36.3 ??C) (Temporal)   Ht 5\' 10"  (1.778 m)   Wt 208 lb 3.2 oz (94.4 kg)   SpO2 98%   BMI 29.87 kg/m??     Body mass index is 29.87 kg/m??.    Physical Exam  Vitals signs reviewed.   Constitutional:       General: He is not in acute distress.     Appearance: Normal appearance. He is not ill-appearing.  HENT:      Head: Normocephalic and atraumatic.   Pulmonary:      Effort: Pulmonary effort is normal.   Genitourinary:     Prostate: Normal.      Rectum: Normal.   Musculoskeletal:         General: No swelling.   Skin:     Coloration: Skin is not jaundiced.      Findings: No rash.   Neurological:      General: No focal deficit present.      Mental Status: He is alert. Mental status is at baseline.      Cranial Nerves: No cranial nerve deficit.      Motor: No weakness.      Gait: Gait normal.   Psychiatric:         Mood and Affect: Mood normal.         Behavior: Behavior normal.         Thought Content: Thought content normal.         Judgment: Judgment normal.       Lab Results   Component Value Date/Time    Prostate Specific Ag 10.8 (H) 06/05/2019 04:02 PM         Assessment/Plan:    ICD-10-CM ICD-9-CM    1. Elevated PSA  R97.20 790.93 PSA, DIAGNOSTIC (PROSTATE SPECIFIC AG)   2. Primary insomnia  F51.01 307.42 traZODone (DESYREL) 50 mg tablet     Encounter Diagnoses   Name Primary?   ??? Elevated PSA Yes   ??? Primary insomnia      Diagnoses and all orders for this visit:    1. Elevated PSA  -     PSA, DIAGNOSTIC (PROSTATE SPECIFIC AG)    2. Primary insomnia  -     traZODone (DESYREL) 50 mg tablet; Take 1 Tab by  mouth nightly.      Follow-up and Dispositions    ?? Return if symptoms worsen or fail to improve.           __  Suan Halter, M.D.

## 2019-06-26 NOTE — Progress Notes (Signed)
CT Calcium score report reviewed and is 0 which is normal.   Repeat in 5 years. Please inform the patient

## 2019-06-26 NOTE — Progress Notes (Signed)
PSA has dropped significantly after antibiotic treatment.  Will recheck at his next visit to see if it continues to improve.  Please inform the patient

## 2019-06-26 NOTE — Progress Notes (Signed)
CT Calcium score report reviewed and is 0 which is normal.   Repeat in 5 years. Please inform the patient.    Patient has been informed and voiced understanding.

## 2019-06-26 NOTE — Progress Notes (Signed)
The pt has an appt today at 1330.  We will discuss at his appt.

## 2019-06-27 LAB — PSA PROSTATIC SPECIFIC ANTIGEN: PSA: 5.4 ng/mL — ABNORMAL HIGH (ref 0.0–4.0)

## 2019-06-27 LAB — PSA, DIAGNOSTIC (PROSTATE SPECIFIC AG): Prostate Specific Ag: 5.4 ng/mL — ABNORMAL HIGH (ref 0.0–4.0)

## 2019-08-01 ENCOUNTER — Encounter: Attending: Internal Medicine | Primary: Internal Medicine

## 2019-12-05 ENCOUNTER — Encounter: Attending: Internal Medicine | Primary: Internal Medicine

## 2020-07-19 ENCOUNTER — Encounter: Attending: Internal Medicine | Primary: Internal Medicine

## 2020-08-12 NOTE — Telephone Encounter (Signed)
 Received call from [place] at [facility] with [employer].    Subjective: Caller states He has been real tired lately     Current Symptoms: Last night was walking with a wide gate.  Hands are shaking.  Still walking with wide gate today.  Fatigue.  May have been with someone 1 month ago that may have a STD.  Hard to keep focus on conversations.  Suffers from depression.  Was sweating yesterday.  Slurred speech.  No appetite.     Onset: a few weeksworsening    Associated Symptoms: NA    Pain Severity:     Temperature: denies fever     What has been tried:     LMP: NA Pregnant: NA    Recommended disposition: Go to ED Now    Care advice provided, patient verbalizes understanding; denies any other questions or concerns; instructed to call back for any new or worsening symptoms.    Writer provided warm transfer to Jerica at [facility] for refusal of ED disposition    Attention Provider:  Thank you for allowing me to participate in the care of your patient.  The patient was connected to triage in response to information provided to the ECC.  Please do not respond through this encounter as the response is not directed to a shared pool.    Reason for Disposition  . Confusion, disorientation, or hallucinations is main symptom  . Very strange or paranoid behavior    Protocols used: NEUROLOGIC DEFICIT-ADULT-OH, CONFUSION - DELIRIUM-ADULT-OH

## 2020-08-12 NOTE — Telephone Encounter (Signed)
Pt neighbor Andreas Blower called and stated that he went to check  on pt has he normally does. And noticed that the pt was laying on the couch. He stated pt speech was slurred and that he was extremely tired, he also noticed pt hands was shaky and that it was hard for pt to focus on a conversation when being spoken to. I advised the neighbor to call 911 or take him to the nearest ER for evaluation

## 2020-08-13 ENCOUNTER — Emergency Department: Admit: 2020-08-13 | Primary: Internal Medicine

## 2020-08-13 ENCOUNTER — Inpatient Hospital Stay
Admit: 2020-08-13 | Discharge: 2020-08-13 | Disposition: A | Discharge status: Home / Self Care | Attending: Emergency Medicine

## 2020-08-13 DIAGNOSIS — F10229 Alcohol dependence with intoxication, unspecified: Secondary | ICD-10-CM

## 2020-08-13 LAB — EKG 12-LEAD
Atrial Rate: 102 {beats}/min
P Axis: 51 degrees
P-R Interval: 144 ms
Q-T Interval: 350 ms
QRS Duration: 90 ms
QTc Calculation (Bazett): 456 ms
R Axis: 63 degrees
T Axis: 46 degrees
Ventricular Rate: 102 {beats}/min

## 2020-08-13 LAB — URINALYSIS W/ RFLX MICROSCOPIC
Bilirubin, Urine: NEGATIVE
Bilirubin: NEGATIVE
Glucose, Ur: NEGATIVE mg/dL
Glucose: NEGATIVE mg/dL
Ketone: NEGATIVE mg/dL
Ketones, Urine: NEGATIVE mg/dL
Leukocyte Esterase, Urine: NEGATIVE
Leukocyte Esterase: NEGATIVE
Nitrite, Urine: NEGATIVE
Nitrites: NEGATIVE
Protein, UA: 30 mg/dL — AB
Protein: 30 mg/dL — AB
Specific Gravity, UA: 1.01 (ref 1.001–1.023)
Specific gravity: 1.01 (ref 1.001–1.023)
Urobilinogen, UA, POCT: 0.2 EU/dL (ref 0.2–1.0)
Urobilinogen: 0.2 EU/dL (ref 0.2–1.0)
pH (UA): 6 (ref 5.0–9.0)
pH, UA: 6 (ref 5.0–9.0)

## 2020-08-13 LAB — URINE MICROSCOPIC
BACTERIA, URINE: 0 /hpf
Bacteria: 0 /hpf
Crystals, UA: 0 /LPF
Crystals, urine: 0 /LPF
MUCUS, URINE: 0 /lpf
Mucus: 0 /lpf

## 2020-08-13 LAB — DRUG SCREEN, URINE
AMPHETAMINES: NEGATIVE
Amphetamine Screen, Urine: NEGATIVE
BARBITURATES: NEGATIVE
BENZODIAZEPINES: NEGATIVE
Barbiturate Screen, Urine: NEGATIVE
Benzodiazepine Screen, Urine: NEGATIVE
COCAINE: NEGATIVE
Cocaine Screen Urine: NEGATIVE
METHADONE: NEGATIVE
Methadone Screen, Urine: NEGATIVE
OPIATES: NEGATIVE
Opiate Screen, Urine: NEGATIVE
PCP Screen, Urine: NEGATIVE
PCP(PHENCYCLIDINE): NEGATIVE
THC (TH-CANNABINOL): NEGATIVE
THC Screen, Urine: NEGATIVE

## 2020-08-13 LAB — CBC WITH AUTO DIFFERENTIAL
Basophils %: 3 % — ABNORMAL HIGH (ref 0.0–2.0)
Basophils Absolute: 0.1 10*3/uL (ref 0.0–0.2)
Eosinophils %: 0 % — ABNORMAL LOW (ref 0.5–7.8)
Eosinophils Absolute: 0 10*3/uL (ref 0.0–0.8)
Granulocyte Absolute Count: 0 10*3/uL (ref 0.0–0.5)
Hematocrit: 48.2 % (ref 41.1–50.3)
Hemoglobin: 16.7 g/dL (ref 13.6–17.2)
Immature Granulocytes: 1 % (ref 0.0–5.0)
Lymphocytes %: 53 % — ABNORMAL HIGH (ref 13–44)
Lymphocytes Absolute: 2 10*3/uL (ref 0.5–4.6)
MCH: 32.2 PG (ref 26.1–32.9)
MCHC: 34.6 g/dL (ref 31.4–35.0)
MCV: 93.1 FL (ref 79.6–97.8)
MPV: 8.6 FL — ABNORMAL LOW (ref 9.4–12.3)
Monocytes %: 19 % — ABNORMAL HIGH (ref 4.0–12.0)
Monocytes Absolute: 0.7 10*3/uL (ref 0.1–1.3)
NRBC Absolute: 0 10*3/uL (ref 0.0–0.2)
Neutrophils %: 23 % — ABNORMAL LOW (ref 43–78)
Neutrophils Absolute: 0.9 10*3/uL — ABNORMAL LOW (ref 1.7–8.2)
Platelets: 212 10*3/uL (ref 150–450)
RBC: 5.18 M/uL (ref 4.23–5.6)
RDW: 13.2 % (ref 11.9–14.6)
WBC: 3.8 10*3/uL — ABNORMAL LOW (ref 4.3–11.1)

## 2020-08-13 LAB — COMPREHENSIVE METABOLIC PANEL
ALT: 232 U/L — ABNORMAL HIGH (ref 12–65)
AST: 272 U/L — ABNORMAL HIGH (ref 15–37)
Albumin/Globulin Ratio: 1.2 (ref 1.2–3.5)
Albumin: 4.8 g/dL (ref 3.5–5.0)
Alkaline Phosphatase: 71 U/L (ref 50–136)
Anion Gap: 14 mmol/L (ref 7–16)
BUN: 10 MG/DL (ref 6–23)
CO2: 22 mmol/L (ref 21–32)
Calcium: 8.9 MG/DL (ref 8.3–10.4)
Chloride: 102 mmol/L (ref 98–107)
Creatinine: 1 MG/DL (ref 0.8–1.5)
EGFR IF NonAfrican American: >60 mL/min/{1.73_m2} (ref >60)
GFR African American: >60 mL/min/{1.73_m2} (ref >60)
Globulin: 3.9 g/dL — ABNORMAL HIGH (ref 2.3–3.5)
Glucose: 91 mg/dL (ref 65–100)
Potassium: 5.8 mmol/L — ABNORMAL HIGH (ref 3.5–5.1)
Sodium: 138 mmol/L (ref 138–145)
Total Bilirubin: 1 MG/DL (ref 0.2–1.1)
Total Protein: 8.7 g/dL — ABNORMAL HIGH (ref 6.3–8.2)

## 2020-08-13 LAB — TSH 3RD GENERATION
TSH: 2 u[IU]/mL (ref 0.358–3.740)
TSH: 2 u[IU]/mL (ref 0.358–3.740)

## 2020-08-13 LAB — MAGNESIUM
Magnesium: 2.4 mg/dL (ref 1.8–2.4)
Magnesium: 2.4 mg/dL (ref 1.8–2.4)

## 2020-08-13 LAB — ETHYL ALCOHOL
ALCOHOL(ETHYL),SERUM: 326 MG/DL
Ethyl Alcohol: 326 MG/DL

## 2020-08-13 LAB — METABOLIC PANEL, COMPREHENSIVE
A-G Ratio: 1.2 (ref 1.2–3.5)
ALT (SGPT): 232 U/L — ABNORMAL HIGH (ref 12–65)
AST (SGOT): 272 U/L — ABNORMAL HIGH (ref 15–37)
Albumin: 4.8 g/dL (ref 3.5–5.0)
Alk. phosphatase: 71 U/L (ref 50–136)
Anion gap: 14 mmol/L (ref 7–16)
BUN: 10 MG/DL (ref 6–23)
Bilirubin, total: 1 MG/DL (ref 0.2–1.1)
CO2: 22 mmol/L (ref 21–32)
Calcium: 8.9 MG/DL (ref 8.3–10.4)
Chloride: 102 mmol/L (ref 98–107)
Creatinine: 1 MG/DL (ref 0.8–1.5)
GFR est AA: >60 mL/min/{1.73_m2} (ref >60)
GFR est non-AA: >60 mL/min/{1.73_m2} (ref >60)
Globulin: 3.9 g/dL — ABNORMAL HIGH (ref 2.3–3.5)
Glucose: 91 mg/dL (ref 65–100)
Potassium: 5.8 mmol/L — ABNORMAL HIGH (ref 3.5–5.1)
Protein, total: 8.7 g/dL — ABNORMAL HIGH (ref 6.3–8.2)
Sodium: 138 mmol/L (ref 138–145)

## 2020-08-13 LAB — CBC WITH AUTOMATED DIFF
ABS. BASOPHILS: 0.1 10*3/uL (ref 0.0–0.2)
ABS. EOSINOPHILS: 0 10*3/uL (ref 0.0–0.8)
ABS. IMM. GRANS.: 0 10*3/uL (ref 0.0–0.5)
ABS. LYMPHOCYTES: 2 10*3/uL (ref 0.5–4.6)
ABS. MONOCYTES: 0.7 10*3/uL (ref 0.1–1.3)
ABS. NEUTROPHILS: 0.9 10*3/uL — ABNORMAL LOW (ref 1.7–8.2)
ABSOLUTE NRBC: 0 10*3/uL (ref 0.0–0.2)
BASOPHILS: 3 % — ABNORMAL HIGH (ref 0.0–2.0)
EOSINOPHILS: 0 % — ABNORMAL LOW (ref 0.5–7.8)
HCT: 48.2 % (ref 41.1–50.3)
HGB: 16.7 g/dL (ref 13.6–17.2)
IMMATURE GRANULOCYTES: 1 % (ref 0.0–5.0)
LYMPHOCYTES: 53 % — ABNORMAL HIGH (ref 13–44)
MCH: 32.2 PG (ref 26.1–32.9)
MCHC: 34.6 g/dL (ref 31.4–35.0)
MCV: 93.1 FL (ref 79.6–97.8)
MONOCYTES: 19 % — ABNORMAL HIGH (ref 4.0–12.0)
MPV: 8.6 FL — ABNORMAL LOW (ref 9.4–12.3)
NEUTROPHILS: 23 % — ABNORMAL LOW (ref 43–78)
PLATELET: 212 10*3/uL (ref 150–450)
RBC: 5.18 M/uL (ref 4.23–5.6)
RDW: 13.2 % (ref 11.9–14.6)
WBC: 3.8 10*3/uL — ABNORMAL LOW (ref 4.3–11.1)

## 2020-08-13 LAB — EKG, 12 LEAD, INITIAL
Atrial Rate: 102 {beats}/min
Calculated P Axis: 51 degrees
Calculated R Axis: 63 degrees
Calculated T Axis: 46 degrees
P-R Interval: 144 ms
Q-T Interval: 350 ms
QRS Duration: 90 ms
QTC Calculation (Bezet): 456 ms
Ventricular Rate: 102 {beats}/min

## 2020-08-13 MED ORDER — CHLORDIAZEPOXIDE 25 MG CAP
25 mg | ORAL_CAPSULE | Freq: Three times a day (TID) | ORAL | 0 refills | Status: AC | PRN
Start: 2020-08-13 — End: ?

## 2020-08-13 MED ORDER — THIAMINE 100 MG/ML INJECTION
100 mg/mL | INTRAMUSCULAR | Status: AC
Start: 2020-08-13 — End: 2020-08-13
  Administered 2020-08-13: 15:00:00 via INTRAVENOUS

## 2020-08-13 MED ORDER — THIAMINE HCL 100 MG TAB
100 mg | ORAL_TABLET | Freq: Every day | ORAL | 0 refills | Status: AC
Start: 2020-08-13 — End: ?

## 2020-08-13 MED ORDER — MAGNESIUM OXIDE 400 MG TAB
400 mg | ORAL_TABLET | Freq: Every day | ORAL | 0 refills | Status: AC
Start: 2020-08-13 — End: ?

## 2020-08-13 MED ORDER — LORAZEPAM 2 MG/ML IJ SOLN
2 mg/mL | INTRAMUSCULAR | Status: AC
Start: 2020-08-13 — End: 2020-08-13
  Administered 2020-08-13: 15:00:00 via INTRAVENOUS

## 2020-08-13 MED FILL — THIAMINE 100 MG/ML INJECTION: 100 mg/mL | INTRAMUSCULAR | Qty: 2

## 2020-08-13 MED FILL — LORAZEPAM 2 MG/ML IJ SOLN: 2 mg/mL | INTRAMUSCULAR | Qty: 1
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
## 2020-08-13 NOTE — ED Notes (Signed)
Report given to Jordan RN, transfer of care at this time.

## 2020-08-13 NOTE — ED Notes (Signed)
 I have reviewed discharge instructions with the patient.  The patient verbalized understanding.    Patient left ED via Discharge Method: ambulatory to Home with friend.    Opportunity for questions and clarification provided.       Patient given 3 scripts.         To continue your aftercare when you leave the hospital, you may receive an automated call from our care team to check in on how you are doing.  This is a free service and part of our promise to provide the best care and service to meet your aftercare needs." If you have questions, or wish to unsubscribe from this service please call [phone].  Thank you for Choosing our [facility] Emergency Department.

## 2020-08-13 NOTE — ED Notes (Signed)
 Pt arrives via pov with a friend. Pt reports he Is very depressed. Pt reports he uses ETOH to self medicate. Pt reports he drinks 6 + beers per day. Pt is very talkative in triage reporting he loves to drink and last night he was drunk. Pt reports over last 2-3 weeks he has been more wobbly with walking. Denies SI/HI.

## 2020-08-18 ENCOUNTER — Inpatient Hospital Stay
Admit: 2020-08-18 | Discharge: 2020-08-18 | Disposition: A | Discharge status: Home / Self Care | Attending: Emergency Medicine

## 2020-08-18 DIAGNOSIS — F10920 Alcohol use, unspecified with intoxication, uncomplicated: Secondary | ICD-10-CM

## 2020-08-18 NOTE — ED Notes (Signed)

## 2020-08-18 NOTE — ED Notes (Signed)
Pt arrived to triage with mask in place. Pt c/o anxiety. Pt states it makes him feel "horrible". Pt given rx for anxiety at ER visit last week.

## 2020-08-18 NOTE — ED Provider Notes (Signed)
Phillip Steele is a 60 year old male with a known history of alcoholism presenting to the emergency department today complaining of feeling disequilibrium.  Patient states he has been drinking this morning.  He also had alcohol last night.  Patient says he normally bottle of wine or more daily.  The patient has gone through detox twice in the last 18 months.  Patient was seen and evaluated here last week and had a CT brain and blood work done.  Patient denies having any new symptoms and says that the Librium he was given did not seem to work so he went back to drinking.           Past Medical History:   Diagnosis Date   ??? Alcohol use 09/21/2017   ??? Anxiety 09/21/2017       Past Surgical History:   Procedure Laterality Date   ??? HX CYST REMOVAL  ?2000    upper back         Family History:   Problem Relation Age of Onset   ??? Dementia Mother    ??? Other Father         unknown   ??? Thyroid Disease Sister        Social History     Socioeconomic History   ??? Marital status: SINGLE     Spouse name: Not on file   ??? Number of children: Not on file   ??? Years of education: Not on file   ??? Highest education level: Not on file   Occupational History   ??? Occupation: Airline pilot- [employer]   Tobacco Use   ??? Smoking status: Never Smoker   ??? Smokeless tobacco: Never Used   Vaping Use   ??? Vaping Use: Never used   Substance and Sexual Activity   ??? Alcohol use: Yes     Comment: 6 beers per day    ??? Drug use: Not Currently   ??? Sexual activity: Not on file   Other Topics Concern   ??? Not on file   Social History Narrative    Divorced - lives alone     Social Determinants of Health     Financial Resource Strain:    ??? Difficulty of Paying Living Expenses: Not on file   Food Insecurity:    ??? Worried About Programme researcher, broadcasting/film/video in the Last Year: Not on file   ??? Ran Out of Food in the Last Year: Not on file   Transportation Needs:    ??? Freight forwarder (Medical): Not on file   ??? Lack of Transportation (Non-Medical): Not on file   Physical Activity:     ??? Days of Exercise per Week: Not on file   ??? Minutes of Exercise per Session: Not on file   Stress:    ??? Feeling of Stress : Not on file   Social Connections:    ??? Frequency of Communication with Friends and Family: Not on file   ??? Frequency of Social Gatherings with Friends and Family: Not on file   ??? Attends Religious Services: Not on file   ??? Active Member of Clubs or Organizations: Not on file   ??? Attends Banker Meetings: Not on file   ??? Marital Status: Not on file   Intimate Partner Violence:    ??? Fear of Current or Ex-Partner: Not on file   ??? Emotionally Abused: Not on file   ??? Physically Abused: Not on file   ??? Sexually Abused:  Not on file   Housing Stability:    ??? Unable to Pay for Housing in the Last Year: Not on file   ??? Number of Places Lived in the Last Year: Not on file   ??? Unstable Housing in the Last Year: Not on file         ALLERGIES: Lexapro [escitalopram]    Review of Systems   Psychiatric/Behavioral:        Intoxication   All other systems reviewed and are negative.      Vitals:    08/18/20 1139   BP: (!) 132/112   Pulse: 98   Resp: 14   Temp: 98.2 ??F (36.8 ??C)   SpO2: 98%   Weight: 86.2 kg (190 lb)   Height: 5\' 10"  (1.778 m)            Physical Exam     GENERAL:The patient has Body mass index is 27.26 kg/m??. Well-hydrated.  No acute distress.  VITAL SIGNS: Heart rate, blood pressure, respiratory rate reviewed as recorded in  nurse's notes  EYES: Pupils reactive. Extraocular motion intact. No conjunctival redness or drainage.  LUNGS: No accessory muscle use  CARDIOVASCULAR: Regular rate and rhythm  EXTREMITIES: Pt moving all 4 extremities with out limitations. Normal muscle tone.  NEUROLOGIC: Cranial nerve exam reveals face is symmetrical, tongue is midline  speech is clear. No focal deficits noted  SKIN: No rash or petechiae. Good skin turgor palpated.  PSYCHIATRIC: Alert and oriented.  Patient intoxicated but functional.      MDM  Number of Diagnoses or Management  Options  Alcoholic intoxication without complication (HCC)  Alcoholism (HCC)  Diagnosis management comments: Alcohol intoxication, alcohol withdrawal, drug addiction, drug abuse, polysubstance abuse,    Depression, electrolyte abnormality, malnutrition, alcoholic ketoacidosis, Wernickes  encephalopathy, vitamin deficiency, stimulant-induced psychosis         Amount and/or Complexity of Data Reviewed  Review and summarize past medical records: yes           Procedures

## 2020-08-25 ENCOUNTER — Emergency Department (HOSPITAL_COMMUNITY)
Admission: EM | Admit: 2020-08-25 | Discharge: 2020-08-25 | Disposition: A | Discharge status: Home / Self Care | Payer: Self-pay | Attending: Emergency Medicine | Admitting: Emergency Medicine

## 2020-08-25 ENCOUNTER — Emergency Department (HOSPITAL_COMMUNITY): Payer: Self-pay

## 2020-08-25 DIAGNOSIS — Y92410 Unspecified street and highway as the place of occurrence of the external cause: Secondary | ICD-10-CM | POA: Insufficient documentation

## 2020-08-25 DIAGNOSIS — S0083XA Contusion of other part of head, initial encounter: Secondary | ICD-10-CM | POA: Insufficient documentation

## 2020-08-25 DIAGNOSIS — Z20822 Contact with and (suspected) exposure to covid-19: Secondary | ICD-10-CM | POA: Insufficient documentation

## 2020-08-25 DIAGNOSIS — I959 Hypotension, unspecified: Secondary | ICD-10-CM | POA: Insufficient documentation

## 2020-08-25 DIAGNOSIS — T1490XA Injury, unspecified, initial encounter: Secondary | ICD-10-CM

## 2020-08-25 LAB — COMPREHENSIVE METABOLIC PANEL
ALT: 245 U/L — ABNORMAL HIGH (ref 0–44)
AST: 303 U/L — ABNORMAL HIGH (ref 15–41)
Albumin: 4.2 g/dL (ref 3.5–5.0)
Alkaline Phosphatase: 69 U/L (ref 38–126)
Anion gap: 17 — ABNORMAL HIGH (ref 5–15)
BUN: 19 mg/dL (ref 6–20)
CO2: 19 mmol/L — ABNORMAL LOW (ref 22–32)
Calcium: 9.7 mg/dL (ref 8.9–10.3)
Chloride: 101 mmol/L (ref 98–111)
Creatinine, Ser: 1.07 mg/dL (ref 0.61–1.24)
GFR, Estimated: >60 mL/min (ref >60)
Glucose, Bld: 165 mg/dL — ABNORMAL HIGH (ref 70–99)
Potassium: 3.8 mmol/L (ref 3.5–5.1)
Sodium: 137 mmol/L (ref 135–145)
Total Bilirubin: 2.1 mg/dL — ABNORMAL HIGH (ref 0.3–1.2)
Total Protein: 6.7 g/dL (ref 6.5–8.1)

## 2020-08-25 LAB — URINALYSIS, ROUTINE W REFLEX MICROSCOPIC
Bilirubin Urine: NEGATIVE
Glucose, UA: NEGATIVE mg/dL
Hgb urine dipstick: NEGATIVE
Ketones, ur: 5 mg/dL — AB
Leukocytes,Ua: NEGATIVE
Nitrite: NEGATIVE
Protein, ur: 30 mg/dL — AB
Specific Gravity, Urine: 1.025 (ref 1.005–1.030)
pH: 6 (ref 5.0–8.0)

## 2020-08-25 LAB — I-STAT CHEM 8, ED
BUN: 20 mg/dL (ref 6–20)
Calcium, Ion: 1.14 mmol/L — ABNORMAL LOW (ref 1.15–1.40)
Chloride: 102 mmol/L (ref 98–111)
Creatinine, Ser: 0.8 mg/dL (ref 0.61–1.24)
Glucose, Bld: 165 mg/dL — ABNORMAL HIGH (ref 70–99)
HCT: 40 % (ref 39.0–52.0)
Hemoglobin: 13.6 g/dL (ref 13.0–17.0)
Potassium: 3.8 mmol/L (ref 3.5–5.1)
Sodium: 136 mmol/L (ref 135–145)
TCO2: 22 mmol/L (ref 22–32)

## 2020-08-25 LAB — CBC
HCT: 39.2 % (ref 39.0–52.0)
Hemoglobin: 13.1 g/dL (ref 13.0–17.0)
MCH: 33.2 pg (ref 26.0–34.0)
MCHC: 33.4 g/dL (ref 30.0–36.0)
MCV: 99.5 fL (ref 80.0–100.0)
Platelets: 106 10*3/uL — ABNORMAL LOW (ref 150–400)
RBC: 3.94 MIL/uL — ABNORMAL LOW (ref 4.22–5.81)
RDW: 13.3 % (ref 11.5–15.5)
WBC: 4.2 10*3/uL (ref 4.0–10.5)
nRBC: 0 % (ref 0.0–0.2)

## 2020-08-25 LAB — PROTIME-INR
INR: 1.1 (ref 0.8–1.2)
Prothrombin Time: 13.9 seconds (ref 11.4–15.2)

## 2020-08-25 LAB — SAMPLE TO BLOOD BANK

## 2020-08-25 LAB — RESP PANEL BY RT-PCR (FLU A&B, COVID) ARPGX2
Influenza A by PCR: NEGATIVE
Influenza B by PCR: NEGATIVE
SARS Coronavirus 2 by RT PCR: NEGATIVE

## 2020-08-25 LAB — ETHANOL: Alcohol, Ethyl (B): <10 mg/dL (ref <10)

## 2020-08-25 LAB — LACTIC ACID, PLASMA: Lactic Acid, Venous: 6.3 mmol/L (ref 0.5–1.9)

## 2020-08-25 MED ORDER — LORAZEPAM 1 MG PO TABS: 1.0000 mg | ORAL_TABLET | Freq: Three times a day (TID) | ORAL | 0 refills | Status: AC | PRN

## 2020-08-25 MED ORDER — LORAZEPAM 1 MG PO TABS: 0.0000 mg | ORAL_TABLET | Freq: Four times a day (QID) | ORAL | Status: DC

## 2020-08-25 MED ORDER — THIAMINE HCL 100 MG PO TABS: 100.0000 mg | ORAL_TABLET | Freq: Every day | ORAL | Status: DC

## 2020-08-25 MED ORDER — LORAZEPAM 1 MG PO TABS: 1.0000 mg | ORAL_TABLET | Freq: Three times a day (TID) | ORAL | 0 refills | Status: DC | PRN

## 2020-08-25 MED ORDER — LORAZEPAM 2 MG/ML IJ SOLN: 0.0000 mg | Freq: Two times a day (BID) | INTRAMUSCULAR | Status: DC

## 2020-08-25 MED ORDER — LORAZEPAM 1 MG PO TABS: 0.0000 mg | ORAL_TABLET | Freq: Two times a day (BID) | ORAL | Status: DC

## 2020-08-25 MED ORDER — LORAZEPAM 2 MG/ML IJ SOLN
0.0000 mg | Freq: Four times a day (QID) | INTRAMUSCULAR | Status: DC
  Administered 2020-08-25: 1 mg via INTRAVENOUS
  Filled 2020-08-25: qty 1

## 2020-08-25 MED ORDER — LORAZEPAM 1 MG PO TABS
2.0000 mg | ORAL_TABLET | Freq: Once | ORAL | Status: AC
  Administered 2020-08-25: 2 mg via ORAL
  Filled 2020-08-25: qty 2

## 2020-08-25 MED ORDER — THIAMINE HCL 100 MG/ML IJ SOLN
100.0000 mg | Freq: Every day | INTRAMUSCULAR | Status: DC
  Administered 2020-08-25: 100 mg via INTRAVENOUS
  Filled 2020-08-25: qty 2

## 2020-08-25 MED ORDER — SODIUM CHLORIDE 0.9 % IV BOLUS
1000.0000 mL | Freq: Once | INTRAVENOUS | Status: AC
Start: 2020-08-25 — End: 2020-08-25
  Administered 2020-08-25: 1000 mL via INTRAVENOUS

## 2020-08-25 NOTE — ED Notes (Signed)
Patient Alert and oriented to baseline. Stable and ambulatory to baseline. Patient verbalized understanding of the discharge instructions.  Patient belongings were taken by the patient.   
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
## 2020-08-25 NOTE — ED Notes (Addendum)
MD made aware pt ride is several hours away, verbal order to give 2mg  PO ativan prior to discharge.
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
## 2020-09-15 ENCOUNTER — Encounter: Attending: Internal Medicine | Primary: Internal Medicine

## 2020-09-22 ENCOUNTER — Ambulatory Visit: Admit: 2020-09-22 | Discharge: 2020-09-22 | Attending: Internal Medicine | Primary: Internal Medicine

## 2020-09-22 DIAGNOSIS — F419 Anxiety disorder, unspecified: Secondary | ICD-10-CM

## 2020-09-22 MED ORDER — DESVENLAFAXINE SUCCINATE ER 25 MG PO TB24
25 | ORAL_TABLET | Freq: Every day | ORAL | 3 refills | Status: DC
Start: 2020-09-22 — End: 2020-11-04

## 2020-09-22 NOTE — ACP (Advance Care Planning) (Signed)
The pt has a living will/POA and is getting this updated.  He will bring a copy by to [place].
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
## 2020-10-21 DIAGNOSIS — F1010 Alcohol abuse, uncomplicated: Secondary | ICD-10-CM

## 2020-10-21 NOTE — ED Triage Notes (Signed)
Pt request help with alcohol, reports recent detox and then began drinking immediately after leaving the program.  He is a 6-10 drinks/day of beer and/or wine. He states repeatedly at triage: "why is this such a nasty disease""

## 2020-10-21 NOTE — ED Provider Notes (Signed)
Patient eloped prior to physician evaluation.     Lauris Poag, MD  10/22/20 [phone]

## 2020-10-22 ENCOUNTER — Inpatient Hospital Stay: Admit: 2020-10-22 | Discharge: 2020-10-22 | Discharge status: Against Medical Advice | Attending: Emergency Medicine

## 2020-10-22 LAB — CBC WITH AUTO DIFFERENTIAL
Absolute Eos #: 0 10*3/uL (ref 0.0–0.8)
Absolute Immature Granulocyte: 0 10*3/uL (ref 0.0–0.5)
Absolute Lymph #: 2.1 10*3/uL (ref 0.5–4.6)
Absolute Mono #: 1.2 10*3/uL (ref 0.1–1.3)
Basophils Absolute: 0.1 10*3/uL (ref 0.0–0.2)
Basophils: 1 % (ref 0.0–2.0)
Eosinophils %: 0 % — ABNORMAL LOW (ref 0.5–7.8)
Hematocrit: 44.2 % (ref 41.1–50.3)
Hemoglobin: 15.8 g/dL (ref 13.6–17.2)
Immature Granulocytes: 0 % (ref 0.0–5.0)
Lymphocytes: 49 % — ABNORMAL HIGH (ref 13–44)
MCH: 32 PG (ref 26.1–32.9)
MCHC: 35.7 g/dL — ABNORMAL HIGH (ref 31.4–35.0)
MCV: 89.7 FL (ref 79.6–97.8)
MPV: 8.4 FL — ABNORMAL LOW (ref 9.4–12.3)
Monocytes: 29 % — ABNORMAL HIGH (ref 4.0–12.0)
Platelets: 382 10*3/uL (ref 150–450)
RBC: 4.93 M/uL (ref 4.23–5.6)
RDW: 11.9 % (ref 11.9–14.6)
Seg Neutrophils: 20 % — ABNORMAL LOW (ref 43–78)
Segs Absolute: 0.9 10*3/uL — ABNORMAL LOW (ref 1.7–8.2)
WBC: 4.3 10*3/uL (ref 4.3–11.1)
nRBC: 0 10*3/uL (ref 0.0–0.2)

## 2020-10-22 LAB — COMPREHENSIVE METABOLIC PANEL
ALT: 451 U/L — ABNORMAL HIGH (ref 12–65)
AST: 161 U/L — ABNORMAL HIGH (ref 15–37)
Albumin/Globulin Ratio: 1.3 (ref 1.2–3.5)
Albumin: 4.5 g/dL (ref 3.5–5.0)
Alk Phosphatase: 84 U/L (ref 50–136)
Anion Gap: 11 mmol/L (ref 7–16)
BUN: 8 MG/DL (ref 6–23)
CO2: 22 mmol/L (ref 21–32)
Calcium: 9.1 MG/DL (ref 8.3–10.4)
Chloride: 101 mmol/L (ref 98–107)
Creatinine: 1 MG/DL (ref 0.8–1.5)
GFR African American: >60 mL/min/{1.73_m2} (ref >60)
GFR Non-African American: >60 mL/min/{1.73_m2} (ref >60)
Globulin: 3.6 g/dL — ABNORMAL HIGH (ref 2.3–3.5)
Glucose: 116 mg/dL — ABNORMAL HIGH (ref 65–100)
Potassium: 4.2 mmol/L (ref 3.5–5.1)
Sodium: 134 mmol/L — ABNORMAL LOW (ref 138–145)
Total Bilirubin: 0.7 MG/DL (ref 0.2–1.1)
Total Protein: 8.1 g/dL (ref 6.3–8.2)

## 2020-10-22 LAB — SALICYLATE LEVEL: Salicylate, Serum: <2 MG/DL — ABNORMAL LOW (ref 2.8–20.0)

## 2020-10-22 LAB — MAGNESIUM: Magnesium: 2.4 mg/dL (ref 1.8–2.4)

## 2020-10-22 LAB — ACETAMINOPHEN LEVEL: Acetaminophen Level: <10 ug/mL — ABNORMAL LOW (ref 10.0–30.0)

## 2020-10-22 LAB — ETHANOL: Ethanol Lvl: 381 MG/DL

## 2020-11-03 ENCOUNTER — Encounter: Attending: Internal Medicine | Primary: Internal Medicine

## 2020-11-04 ENCOUNTER — Ambulatory Visit: Admit: 2020-11-04 | Discharge: 2020-11-04 | Attending: Internal Medicine | Primary: Internal Medicine

## 2020-11-04 DIAGNOSIS — F1010 Alcohol abuse, uncomplicated: Secondary | ICD-10-CM

## 2020-11-04 NOTE — Progress Notes (Signed)
[facility]  [facility]  Danelle Earthly, M.D.  Internal Medicine  [address]  [place], [place] [address]  Office : [phone]  Fax : [phone]    Chief Complaint   Patient presents with    Depression     The pt is in for a 6 wk f/u on depression, anxiety, and alcohol use       History of Present Illness:  Phillip Steele is a 60 y.o. male.  HPI    Anxiety/Depression/Alcohol Abuse  Patient has attended 2 inpatient detox treatments since the last visit for 6 days each.  One in [place] and one in [place].  He is doing better on current medication but he does not have any follow up with psychiatry or addiction medication.  He is currently not working and may be unemployed.  He may not be able to work    Past Medical History:  Past Medical History:   Diagnosis Date    Alcohol use 09/21/2017    Anxiety 09/21/2017     Past Surgical History:  Past Surgical History:   Procedure Laterality Date    CYST REMOVAL  ?2000    upper back     Allergies:   Allergies   Allergen Reactions    Chlordiazepoxide Other (See Comments)     "Made me depressed"    Escitalopram Other (See Comments)     SIDE EFFECTS     Medications:   Current Outpatient Medications   Medication Sig Dispense Refill    desvenlafaxine succinate (PRISTIQ) 50 MG TB24 extended release tablet Take 50 mg by mouth in the morning.      gabapentin (NEURONTIN) 300 MG capsule Take 300 mg by mouth in the morning and 300 mg at noon and 300 mg before bedtime.      QUEtiapine (SEROQUEL) 25 MG tablet Take 25 mg by mouth 1-2 tabs po QHS      glycopyrrolate (ROBINUL) 1 MG tablet Take 1 mg by mouth nightly      Nutritional Supplements (LIVER DEFENSE) TABS Take 1 tablet by mouth daily      traZODone (DESYREL) 50 MG tablet Take 50 mg by mouth nightly       No current facility-administered medications for this visit.     Social History:  Social History     Tobacco Use    Smoking status: Never    Smokeless tobacco: Never   Substance Use Topics     Alcohol use: Not Currently     Family History  Family History   Problem Relation Age of Onset    Thyroid Disease Sister     Other Father         unknown    Dementia Mother        Review of Systems   Constitutional:  Negative for appetite change and fatigue.   Respiratory:  Negative for chest tightness and shortness of breath.    Cardiovascular:  Negative for chest pain.   Neurological:  Negative for seizures and syncope.   Psychiatric/Behavioral:  Negative for agitation, confusion, dysphoric mood and suicidal ideas. The patient is not nervous/anxious.        Vital Signs  BP 131/74 (Site: Right Upper Arm, Position: Sitting, Cuff Size: Medium Adult)    Pulse 84    Temp 99.1 ??F (37.3 ??C) (Temporal)    Ht 5\' 9"  (1.753 m)    Wt 191 lb 3.2 oz (86.7 kg)    SpO2 97%  BMI 28.24 kg/m??   Body mass index is 28.24 kg/m??.    Physical Exam  Vitals reviewed.   Constitutional:       General: He is not in acute distress.     Appearance: Normal appearance. He is not ill-appearing.   HENT:      Head: Normocephalic and atraumatic.   Eyes:      General: No scleral icterus.     Extraocular Movements: Extraocular movements intact.      Conjunctiva/sclera: Conjunctivae normal.   Pulmonary:      Effort: Pulmonary effort is normal.   Skin:     Coloration: Skin is not jaundiced.      Findings: No rash.   Neurological:      General: No focal deficit present.      Mental Status: He is alert. Mental status is at baseline.      Cranial Nerves: No cranial nerve deficit.      Motor: No weakness.      Gait: Gait normal.   Psychiatric:         Attention and Perception: He is attentive. He does not perceive auditory or visual hallucinations.         Mood and Affect: Mood normal. Mood is not anxious.         Speech: Speech is rapid and pressured.         Behavior: Behavior is hyperactive.         Thought Content: Thought content normal.         Cognition and Memory: Cognition and memory normal.         Judgment: Judgment normal.          Assessment/Plan:  Okechukwu was seen today for depression.    Diagnoses and all orders for this visit:    Alcohol abuse  -     External Referral to Psychiatry    Anxiety  -     External Referral to Psychiatry    Dysthymia (or depressive neurosis)  -     External Referral to Psychiatry  May need to apply for disability  Continue all current medication  Return in about 2 months (around 01/05/2021), or if symptoms worsen or fail to improve.  __  Margrett Rud, M.D.

## 2020-11-05 ENCOUNTER — Encounter: Attending: Internal Medicine | Primary: Internal Medicine

## 2020-11-19 NOTE — Telephone Encounter (Signed)
Received fax from [facility] that was sent regarding patient referral.  Fax stated they are not accepting new patients.  A teams message was sent to Jackqulyn Livings, referral coordinator stating above

## 2021-01-03 ENCOUNTER — Encounter: Attending: Internal Medicine | Primary: Internal Medicine

## 2021-01-17 NOTE — Telephone Encounter (Signed)
I do not prescribe these medications.  I referred him to psychiatry.  He has no future appointments scheduled

## 2021-01-17 NOTE — Telephone Encounter (Signed)
Patient came in concerning prescriptions.  He states that Dr.Budelmann told him to call Claris Gower and have them transferred to [facility] and he would refill them. He went to [facility] and the only on refilled was   desvenlafaxine succinate (PRISTIQ) 50 MG TB24 extended release tablet. He still needs the   glycopyrrolate (ROBINUL) 1 MG tablet  QUEtiapine (SEROQUEL) 25 MG tablet

## 2021-02-03 NOTE — Telephone Encounter (Signed)
Pt brought in some prescriptions he needs sent in to [facility]. I put them in the white binder up front.

## 2021-02-11 ENCOUNTER — Ambulatory Visit: Admit: 2021-02-11 | Discharge: 2021-02-11 | Attending: Internal Medicine | Primary: Internal Medicine

## 2021-02-11 DIAGNOSIS — J018 Other acute sinusitis: Secondary | ICD-10-CM

## 2021-02-11 MED ORDER — AMOXICILLIN-POT CLAVULANATE 875-125 MG PO TABS
875-125 MG | ORAL_TABLET | Freq: Two times a day (BID) | ORAL | 0 refills | Status: AC
Start: 2021-02-11 — End: 2021-02-21

## 2021-02-11 MED ORDER — GABAPENTIN 300 MG PO CAPS
300 MG | ORAL_CAPSULE | Freq: Every evening | ORAL | 1 refills | Status: AC
Start: 2021-02-11 — End: 2021-05-12

## 2021-02-11 MED ORDER — DESVENLAFAXINE SUCCINATE ER 50 MG PO TB24
50 MG | ORAL_TABLET | Freq: Every day | ORAL | 1 refills | Status: AC
Start: 2021-02-11 — End: ?

## 2021-02-11 MED ORDER — QUETIAPINE FUMARATE 25 MG PO TABS
25 MG | ORAL_TABLET | Freq: Two times a day (BID) | ORAL | 1 refills | Status: DC
Start: 2021-02-11 — End: 2021-09-15

## 2021-02-11 MED ORDER — TRAZODONE HCL 50 MG PO TABS
50 MG | ORAL_TABLET | Freq: Every evening | ORAL | 1 refills | Status: DC
Start: 2021-02-11 — End: 2021-05-24

## 2021-02-11 NOTE — Progress Notes (Signed)
[facility]  [facility]  Danelle Earthly, M.D.  Internal Medicine  [address]  [place], [place] [address]  Office : [phone]  Fax : [phone]    Chief Complaint   Patient presents with    Anxiety    Cough     Cough and congestion x3 days       History of Present Illness:  Phillip Steele is a 60 y.o. male.  URI   This is a new problem. The current episode started in the past 7 days. The problem has been gradually worsening. There has been no fever. Associated symptoms include congestion, coughing, rhinorrhea and a sore throat. Pertinent negatives include no chest pain, ear pain, headaches, nausea or rash. He has tried decongestant for the symptoms. The treatment provided mild relief.     Anxiety/Depression/Alcohol Abuse  Patient has attended 2 inpatient detox treatments previously.  One in [place] and one in [place].  He did better on medication but he does not have any follow up with psychiatry or addiction medication.  He is currently seeking employment.   He is going to [employer] and remaining off of substances    Past Medical History:  Past Medical History:   Diagnosis Date    Alcohol use 09/21/2017    Anxiety 09/21/2017     Past Surgical History:  Past Surgical History:   Procedure Laterality Date    CYST REMOVAL  ?2000    upper back     Allergies:   Allergies   Allergen Reactions    Chlordiazepoxide Other (See Comments)     "Made me depressed"    Escitalopram Other (See Comments)     SIDE EFFECTS     Medications:   Current Outpatient Medications   Medication Sig Dispense Refill    LORazepam (ATIVAN) 1 MG tablet Take 1 mg by mouth every 6 hours as needed for Anxiety.      desvenlafaxine succinate (PRISTIQ) 50 MG TB24 extended release tablet Take 1 tablet by mouth daily 90 tablet 1    gabapentin (NEURONTIN) 300 MG capsule Take 1 capsule by mouth at bedtime for 90 days. 90 capsule 1    QUEtiapine (SEROQUEL) 25 MG tablet Take 1 tablet by mouth 2 times daily 1-2 tabs po  QHS 180 tablet 1    traZODone (DESYREL) 50 MG tablet Take 1 tablet by mouth nightly 90 tablet 1    amoxicillin-clavulanate (AUGMENTIN) 875-125 MG per tablet Take 1 tablet by mouth 2 times daily for 10 days 20 tablet 0    Nutritional Supplements (LIVER DEFENSE) TABS Take 1 tablet by mouth daily      glycopyrrolate (ROBINUL) 1 MG tablet Take 1 mg by mouth nightly       No current facility-administered medications for this visit.     Social History:  Social History     Tobacco Use    Smoking status: Never    Smokeless tobacco: Never   Substance Use Topics    Alcohol use: Not Currently     Family History  Family History   Problem Relation Age of Onset    Thyroid Disease Sister     Other Father         unknown    Dementia Mother        Review of Systems   Constitutional:  Negative for chills, fatigue and fever.   HENT:  Positive for congestion, rhinorrhea and sore throat. Negative for ear pain.  Respiratory:  Positive for cough. Negative for shortness of breath.    Cardiovascular:  Negative for chest pain.   Gastrointestinal:  Negative for nausea.   Skin:  Negative for rash.   Neurological:  Negative for headaches.   Psychiatric/Behavioral:  Negative for dysphoric mood.        Vital Signs  BP 132/87 (Site: Left Upper Arm, Position: Sitting, Cuff Size: Large Adult)    Pulse 65    Temp 98.1 ??F (36.7 ??C) (Temporal)    Ht 5\' 9"  (1.753 m)    Wt 191 lb (86.6 kg)    BMI 28.21 kg/m??   Body mass index is 28.21 kg/m??.    Physical Exam  Vitals reviewed.   Constitutional:       General: He is not in acute distress.     Appearance: Normal appearance. He is not ill-appearing or toxic-appearing.   HENT:      Head: Normocephalic and atraumatic.      Right Ear: Tympanic membrane, ear canal and external ear normal. There is no impacted cerumen.      Left Ear: Tympanic membrane, ear canal and external ear normal. There is no impacted cerumen.      Nose: Nose normal.      Mouth/Throat:      Mouth: Mucous membranes are moist.      Pharynx:  No oropharyngeal exudate or posterior oropharyngeal erythema.   Eyes:      General: No scleral icterus.     Conjunctiva/sclera: Conjunctivae normal.   Cardiovascular:      Rate and Rhythm: Normal rate and regular rhythm.      Heart sounds: Normal heart sounds. No murmur heard.  Pulmonary:      Effort: Pulmonary effort is normal.      Breath sounds: Normal breath sounds.   Musculoskeletal:         General: No swelling.   Lymphadenopathy:      Cervical: No cervical adenopathy.   Skin:     Coloration: Skin is not jaundiced.      Findings: No rash.   Neurological:      General: No focal deficit present.      Mental Status: He is alert. Mental status is at baseline.      Cranial Nerves: No cranial nerve deficit.      Motor: No weakness.      Gait: Gait normal.   Psychiatric:         Mood and Affect: Mood normal.         Behavior: Behavior normal.         Thought Content: Thought content normal.         Judgment: Judgment normal.         Assessment/Plan:  Phillip Steele was seen today for anxiety and cough.    Diagnoses and all orders for this visit:    Acute non-recurrent sinusitis of other sinus  -     amoxicillin-clavulanate (AUGMENTIN) 875-125 MG per tablet; Take 1 tablet by mouth 2 times daily for 10 days    Primary insomnia  -     QUEtiapine (SEROQUEL) 25 MG tablet; Take 1 tablet by mouth 2 times daily 1-2 tabs po QHS  -     traZODone (DESYREL) 50 MG tablet; Take 1 tablet by mouth nightly    Dysthymia (or depressive neurosis)  -     desvenlafaxine succinate (PRISTIQ) 50 MG TB24 extended release tablet; Take 1 tablet by mouth daily    Mood  disorder (HCC)  -     gabapentin (NEURONTIN) 300 MG capsule; Take 1 capsule by mouth at bedtime for 90 days.      Return in about 3 months (around 05/14/2021), or if symptoms worsen or fail to improve.  __  Margrett Rud, M.D.

## 2021-05-20 ENCOUNTER — Encounter

## 2021-05-23 ENCOUNTER — Encounter

## 2021-05-23 NOTE — Telephone Encounter (Signed)
Pt says that the [facility] pharmacy told him that he had no refills on his traZODone (DESYREL) 50 MG tablet prescription. Says that the pharmacy has been asking the office for refills but get no reply.     Looking in the pt's chart I see that [facility] sent in a 90 day quantity on 02/11/21 with 1 refill so the pt should have a refill there at the pharmacy. If you could give the pharmacy a call to clarify.

## 2021-05-24 MED ORDER — TRAZODONE HCL 50 MG PO TABS
50 MG | ORAL_TABLET | Freq: Every evening | ORAL | 0 refills | Status: AC
Start: 2021-05-24 — End: 2021-09-19

## 2021-05-24 NOTE — Telephone Encounter (Signed)
Spoke to pharmacy and they stated that the patient pick up rx 02/11/2021 and the other rx on 02/26/2021, Rx was sent for 90 day 1 refill on 02/11/2021 spoke with pt and pt stated that he cant remember picking up both in the same month but if they say they gave it to him he believes that the bottle may be in the cabinet in [place] as the patient states that they travel. Pt states that he is at the airport right now and should be back in [place] Thursday and want to know if a refill can be sent cause he is out. Pt made a appointment for the end of march to seen in office . I pend a 30  day supply

## 2021-07-08 ENCOUNTER — Encounter: Attending: Internal Medicine | Primary: Internal Medicine

## 2021-08-15 ENCOUNTER — Encounter

## 2021-09-13 ENCOUNTER — Encounter

## 2021-09-15 ENCOUNTER — Telehealth

## 2021-09-15 MED ORDER — QUETIAPINE FUMARATE 25 MG PO TABS
25 MG | ORAL_TABLET | Freq: Two times a day (BID) | ORAL | 0 refills | Status: DC
Start: 2021-09-15 — End: 2021-09-19

## 2021-09-15 MED ORDER — DESVENLAFAXINE SUCCINATE ER 50 MG PO TB24
50 MG | ORAL_TABLET | Freq: Every day | ORAL | 0 refills | Status: DC
Start: 2021-09-15 — End: 2021-09-19

## 2021-09-15 NOTE — Telephone Encounter (Signed)
-----   Message from [facility] sent at 09/14/2021  1:09 PM EDT -----  Subject: Message to Provider    QUESTIONS  Information for Provider? Patient needs his Anti-depression medication   refilled -does not know what the name is - Patient has an appointment on   Monday the 12th to be seen. Please send the medication to [facility]   pharmacy   ---------------------------------------------------------------------------  --------------  Cleotis Lema INFO  [phone]; OK to leave message on voicemail  ---------------------------------------------------------------------------  --------------  SCRIPT ANSWERS  Relationship to Patient? Self

## 2021-09-15 NOTE — Telephone Encounter (Signed)
Medication was for 5 days

## 2021-09-15 NOTE — Telephone Encounter (Signed)
He has an appointment Monday he should ran out last month did you want to refill them or wait for his appointment he is taking Prestiq and seroquel

## 2021-09-18 ENCOUNTER — Encounter

## 2021-09-19 ENCOUNTER — Ambulatory Visit: Admit: 2021-09-19 | Discharge: 2021-09-19 | Attending: Internal Medicine | Primary: Internal Medicine

## 2021-09-19 DIAGNOSIS — F341 Dysthymic disorder: Secondary | ICD-10-CM

## 2021-09-19 MED ORDER — DESVENLAFAXINE SUCCINATE ER 50 MG PO TB24
50 MG | ORAL_TABLET | Freq: Every day | ORAL | 1 refills | Status: DC
Start: 2021-09-19 — End: 2022-04-20

## 2021-09-19 MED ORDER — QUETIAPINE FUMARATE 25 MG PO TABS
25 MG | ORAL_TABLET | Freq: Every evening | ORAL | 1 refills | Status: DC
Start: 2021-09-19 — End: 2022-04-20

## 2021-09-19 MED ORDER — TRAZODONE HCL 50 MG PO TABS
50 MG | ORAL_TABLET | Freq: Every evening | ORAL | 1 refills | Status: DC
Start: 2021-09-19 — End: 2022-04-20

## 2021-09-19 MED ORDER — GABAPENTIN 300 MG PO CAPS
300 MG | ORAL_CAPSULE | Freq: Every evening | ORAL | 1 refills | Status: AC
Start: 2021-09-19 — End: 2022-03-18

## 2021-09-19 NOTE — Progress Notes (Signed)
[facility]  [facility]  Danelle Earthly, M.D.  Internal Medicine  [address]  [place], [place] [address]  Office : [phone]  Fax : [phone]    Chief Complaint   Patient presents with    Anxiety    Depression       History of Present Illness:  Phillip Steele is a 61 y.o. male.  HPI    Anxiety/Depression/Alcohol Abuse  Patient has attended 2 inpatient detox treatments previously.  One in [place] and one in [place].  He did better on medication but he does not have any follow up with psychiatry or addiction medication.  He is currently seeking employment.   He is going to [employer] and remaining off of substances.  Almost a year sober now    Past Medical History:  Past Medical History:   Diagnosis Date    Alcohol use 09/21/2017    Anxiety 09/21/2017     Past Surgical History:  Past Surgical History:   Procedure Laterality Date    CYST REMOVAL  ?2000    upper back     Allergies:   Allergies   Allergen Reactions    Chlordiazepoxide Other (See Comments)     "Made me depressed"    Escitalopram Other (See Comments)     SIDE EFFECTS     Medications:   Current Outpatient Medications   Medication Sig Dispense Refill    desvenlafaxine succinate (PRISTIQ) 50 MG TB24 extended release tablet Take 1 tablet by mouth daily 90 tablet 1    QUEtiapine (SEROQUEL) 25 MG tablet Take 1 tablet by mouth nightly 1 tabs po QHS 90 tablet 1    traZODone (DESYREL) 50 MG tablet Take 1 tablet by mouth nightly 90 tablet 1    gabapentin (NEURONTIN) 300 MG capsule Take 1 capsule by mouth at bedtime for 180 days. 90 capsule 1    Nutritional Supplements (LIVER DEFENSE) TABS Take 1 tablet by mouth daily       No current facility-administered medications for this visit.     Social History:  Social History     Tobacco Use    Smoking status: Never    Smokeless tobacco: Never   Substance Use Topics    Alcohol use: Not Currently     Family History  Family History   Problem Relation Age of Onset    Thyroid Disease  Sister     Other Father         unknown    Dementia Mother        Review of Systems   Constitutional:  Negative for chills, fatigue and fever.   Respiratory:  Negative for shortness of breath.    Cardiovascular:  Negative for chest pain.   Musculoskeletal:  Negative for gait problem.   Skin:  Negative for rash.   Neurological:  Negative for speech difficulty.   Psychiatric/Behavioral:  Negative for dysphoric mood, sleep disturbance and suicidal ideas. The patient is not nervous/anxious.        Vital Signs  BP 126/74 (Site: Left Upper Arm, Position: Sitting, Cuff Size: Large Adult)   Pulse 71   Temp 97.9 F (36.6 C) (Temporal)   Ht 5\' 9"  (1.753 m)   Wt 210 lb (95.3 kg)   SpO2 98%   BMI 31.01 kg/m   Body mass index is 31.01 kg/m.    Physical Exam  Vitals reviewed.   Constitutional:       General: He is not  in acute distress.     Appearance: Normal appearance. He is not ill-appearing or toxic-appearing.   HENT:      Head: Normocephalic and atraumatic.   Eyes:      General: No scleral icterus.     Conjunctiva/sclera: Conjunctivae normal.   Neck:      Vascular: No carotid bruit.   Cardiovascular:      Rate and Rhythm: Normal rate and regular rhythm.      Heart sounds: Normal heart sounds. No murmur heard.  Pulmonary:      Effort: Pulmonary effort is normal.      Breath sounds: Normal breath sounds.   Musculoskeletal:         General: No swelling.   Skin:     Coloration: Skin is not jaundiced.      Findings: No rash.   Neurological:      General: No focal deficit present.      Mental Status: He is alert. Mental status is at baseline.      Cranial Nerves: No cranial nerve deficit.      Motor: No weakness.      Gait: Gait normal.   Psychiatric:         Mood and Affect: Mood normal.         Behavior: Behavior normal.         Thought Content: Thought content normal.         Judgment: Judgment normal.         Assessment/Plan:  Catcher was seen today for anxiety and depression.    Diagnoses and all orders for this  visit:    Dysthymia (or depressive neurosis)  -     desvenlafaxine succinate (PRISTIQ) 50 MG TB24 extended release tablet; Take 1 tablet by mouth daily    Primary insomnia  -     QUEtiapine (SEROQUEL) 25 MG tablet; Take 1 tablet by mouth nightly 1 tabs po QHS  -     traZODone (DESYREL) 50 MG tablet; Take 1 tablet by mouth nightly    Mood disorder (HCC)  -     gabapentin (NEURONTIN) 300 MG capsule; Take 1 capsule by mouth at bedtime for 180 days.        Return in about 4 months (around 01/19/2022), or if symptoms worsen or fail to improve.  __  Margrett Rud, M.D.

## 2021-09-23 ENCOUNTER — Encounter

## 2022-01-20 ENCOUNTER — Encounter: Attending: Internal Medicine | Primary: Internal Medicine

## 2022-04-03 ENCOUNTER — Encounter

## 2022-04-13 ENCOUNTER — Encounter: Attending: Internal Medicine | Primary: Internal Medicine

## 2022-04-20 ENCOUNTER — Encounter

## 2022-04-20 ENCOUNTER — Encounter: Admit: 2022-04-20 | Discharge: 2022-04-20 | Attending: Internal Medicine | Primary: Internal Medicine

## 2022-04-20 DIAGNOSIS — Z125 Encounter for screening for malignant neoplasm of prostate: Secondary | ICD-10-CM

## 2022-04-20 MED ORDER — TRAZODONE HCL 50 MG PO TABS
50 MG | ORAL_TABLET | Freq: Every evening | ORAL | 1 refills | Status: AC
Start: 2022-04-20 — End: 2022-10-31

## 2022-04-20 MED ORDER — QUETIAPINE FUMARATE 25 MG PO TABS
25 MG | ORAL_TABLET | Freq: Every evening | ORAL | 1 refills | Status: AC
Start: 2022-04-20 — End: 2022-10-31

## 2022-04-20 MED ORDER — GABAPENTIN 300 MG PO CAPS
300 MG | ORAL_CAPSULE | Freq: Every evening | ORAL | 1 refills | Status: AC
Start: 2022-04-20 — End: 2022-10-17

## 2022-04-20 MED ORDER — DESVENLAFAXINE SUCCINATE ER 50 MG PO TB24
50 MG | ORAL_TABLET | Freq: Every day | ORAL | 1 refills | Status: AC
Start: 2022-04-20 — End: 2022-10-31

## 2022-04-20 NOTE — Progress Notes (Signed)
[facility]  [facility]  Delmar Landau, M.D.  [place] [address]  Office : [phone]  Fax : [phone]    Chief Complaint   Patient presents with    Anxiety     6 mo follow up       History of Present Illness:  Phillip Steele is a 62 y.o. male.  HPI      Anxiety/Depression/Alcohol Abuse  Patient has attended 2 inpatient detox treatments previously.  One in [place] and one in [place].  He did better on medication but he does not have any follow up with psychiatry or addiction medication.  He is currently seeking employment.   He is going to [employer] here and [employer], [place] and remaining off of substances.  545 days  sober now        Past Medical History:  Past Medical History:   Diagnosis Date    Alcohol use 09/21/2017    Anxiety 09/21/2017     Past Surgical History:  Past Surgical History:   Procedure Laterality Date    CYST REMOVAL  ?2000    upper back     Allergies:   Allergies   Allergen Reactions    Chlordiazepoxide Other (See Comments)     "Made me depressed"    Escitalopram Other (See Comments)     SIDE EFFECTS     Medications:   Current Outpatient Medications   Medication Sig Dispense Refill    desvenlafaxine succinate (PRISTIQ) 50 MG TB24 extended release tablet Take 1 tablet by mouth daily 90 tablet 1    gabapentin (NEURONTIN) 300 MG capsule Take 1 capsule by mouth at bedtime for 180 days. 90 capsule 1    QUEtiapine (SEROQUEL) 25 MG tablet Take 1 tablet by mouth nightly 1 tabs po QHS 90 tablet 1    traZODone (DESYREL) 50 MG tablet Take 1 tablet by mouth nightly 90 tablet 1    Nutritional Supplements (LIVER DEFENSE) TABS Take 1 tablet by mouth daily       No current facility-administered medications for this visit.     Social History:  Social History     Tobacco Use    Smoking status: Never    Smokeless tobacco: Never   Substance Use Topics    Alcohol use: Not Currently     Family History  Family History   Problem Relation  Age of Onset    Thyroid Disease Sister     Other Father         unknown    Dementia Mother        Review of Systems   Constitutional:  Negative for chills, fatigue and fever.   Respiratory:  Negative for shortness of breath.    Cardiovascular:  Negative for chest pain.   Musculoskeletal:  Negative for gait problem.   Skin:  Negative for rash.   Neurological:  Negative for speech difficulty.   Psychiatric/Behavioral:  Negative for dysphoric mood.          Vital Signs  BP 125/73 (Site: Left Upper Arm, Position: Sitting, Cuff Size: Large Adult)   Pulse 82   Temp 98 F (36.7 C) (Temporal)   Ht 1.753 m (5\' 9" )   Wt 97.1 kg (214 lb)   SpO2 98%   BMI 31.60 kg/m   Body mass index is 31.6 kg/m.    Physical Exam  Vitals reviewed.   Constitutional:  General: He is not in acute distress.     Appearance: Normal appearance. He is not ill-appearing.   HENT:      Head: Normocephalic and atraumatic.   Eyes:      General: No scleral icterus.     Conjunctiva/sclera: Conjunctivae normal.   Neck:      Vascular: No carotid bruit.   Cardiovascular:      Rate and Rhythm: Normal rate and regular rhythm.      Heart sounds: Normal heart sounds. No murmur heard.  Pulmonary:      Effort: Pulmonary effort is normal.      Breath sounds: Normal breath sounds.   Musculoskeletal:         General: No swelling.   Skin:     Coloration: Skin is not jaundiced.      Findings: No rash.   Neurological:      General: No focal deficit present.      Mental Status: He is alert. Mental status is at baseline.      Cranial Nerves: No cranial nerve deficit.      Motor: No weakness.      Gait: Gait normal.   Psychiatric:         Mood and Affect: Mood normal.         Behavior: Behavior normal.         Thought Content: Thought content normal.         Judgment: Judgment normal.       He declines other testing due to high deductible insurance    Assessment/Plan:  Yancey was seen today for anxiety.    Diagnoses and all orders for this visit:    Dysthymia (or  depressive neurosis)  -     desvenlafaxine succinate (PRISTIQ) 50 MG TB24 extended release tablet; Take 1 tablet by mouth daily    Mood disorder (HCC)  -     gabapentin (NEURONTIN) 300 MG capsule; Take 1 capsule by mouth at bedtime for 180 days.    Primary insomnia  -     QUEtiapine (SEROQUEL) 25 MG tablet; Take 1 tablet by mouth nightly 1 tabs po QHS  -     traZODone (DESYREL) 50 MG tablet; Take 1 tablet by mouth nightly        Return in about 6 months (around 10/19/2022), or if symptoms worsen or fail to improve.  __  Suan Halter, M.D.

## 2022-04-21 LAB — HEPATIC FUNCTION PANEL
ALT: 27 U/L (ref 12–65)
AST: 25 U/L (ref 15–37)
Albumin/Globulin Ratio: 1.7 — ABNORMAL HIGH (ref 0.4–1.6)
Albumin: 4.3 g/dL (ref 3.5–5.0)
Alk Phosphatase: 75 U/L (ref 50–136)
Bilirubin, Direct: 0.1 MG/DL (ref <0.4)
Globulin: 2.5 g/dL — ABNORMAL LOW (ref 2.8–4.5)
Total Bilirubin: 0.6 MG/DL (ref 0.2–1.1)
Total Protein: 6.8 g/dL (ref 6.3–8.2)

## 2022-04-21 LAB — BASIC METABOLIC PANEL
Anion Gap: 2 mmol/L (ref 2–11)
BUN: 19 MG/DL (ref 6–23)
CO2: 30 mmol/L (ref 21–32)
Calcium: 9.5 MG/DL (ref 8.3–10.4)
Chloride: 107 mmol/L (ref 103–113)
Creatinine: 1 MG/DL (ref 0.8–1.5)
Est, Glom Filt Rate: >60 mL/min/{1.73_m2} (ref >60)
Glucose: 83 mg/dL (ref 65–100)
Potassium: 5 mmol/L (ref 3.5–5.1)
Sodium: 139 mmol/L (ref 136–146)

## 2022-04-21 LAB — PSA SCREENING: PSA: 9.2 ng/mL — ABNORMAL HIGH (ref <4.0)

## 2022-04-26 ENCOUNTER — Encounter

## 2022-05-05 ENCOUNTER — Encounter: Attending: Internal Medicine | Primary: Internal Medicine
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
## 2022-10-16 ENCOUNTER — Encounter

## 2022-10-16 NOTE — Telephone Encounter (Signed)
Patient is asking if his medications can be sent in until he is able to come in at the beginning of August?     Patient is going out of town, patient stated that he can be reached at [phone] late in the afternoons

## 2022-10-16 NOTE — Telephone Encounter (Signed)
Sending to covering Provider, Agapito Games, NP

## 2022-10-19 ENCOUNTER — Encounter: Attending: Internal Medicine | Primary: Internal Medicine

## 2022-10-31 ENCOUNTER — Encounter

## 2022-10-31 NOTE — Telephone Encounter (Signed)
Patient requests refills on medications. Next appt 11/08/22    He is traveling for work and requests they be sent to [facility] in [facility].

## 2022-11-01 MED ORDER — QUETIAPINE FUMARATE 25 MG PO TABS
25 | ORAL_TABLET | Freq: Every evening | ORAL | 0 refills | Status: DC
Start: 2022-11-01 — End: 2022-11-09

## 2022-11-01 MED ORDER — DESVENLAFAXINE SUCCINATE ER 50 MG PO TB24
50 | ORAL_TABLET | Freq: Every day | ORAL | 0 refills | Status: DC
Start: 2022-11-01 — End: 2022-11-09

## 2022-11-01 MED ORDER — TRAZODONE HCL 50 MG PO TABS
50 | ORAL_TABLET | Freq: Every evening | ORAL | 0 refills | Status: DC
Start: 2022-11-01 — End: 2022-11-09

## 2022-11-01 MED ORDER — GABAPENTIN 300 MG PO CAPS
300 | ORAL_CAPSULE | Freq: Every evening | ORAL | 0 refills | Status: DC
Start: 2022-11-01 — End: 2022-11-09

## 2022-11-08 ENCOUNTER — Encounter: Attending: Internal Medicine | Primary: Internal Medicine

## 2022-11-09 ENCOUNTER — Encounter: Admit: 2022-11-09 | Discharge: 2022-11-09 | Attending: Internal Medicine | Primary: Internal Medicine

## 2022-11-09 DIAGNOSIS — F341 Dysthymic disorder: Secondary | ICD-10-CM

## 2022-11-09 MED ORDER — QUETIAPINE FUMARATE 25 MG PO TABS
25 | ORAL_TABLET | Freq: Every evening | ORAL | 1 refills | Status: AC
Start: 2022-11-09 — End: ?

## 2022-11-09 MED ORDER — DESVENLAFAXINE SUCCINATE ER 50 MG PO TB24
50 | ORAL_TABLET | Freq: Every day | ORAL | 1 refills | Status: AC
Start: 2022-11-09 — End: ?

## 2022-11-09 MED ORDER — GABAPENTIN 300 MG PO CAPS
300 | ORAL_CAPSULE | Freq: Every evening | ORAL | 1 refills | Status: AC
Start: 2022-11-09 — End: 2023-05-08

## 2022-11-09 MED ORDER — TRAZODONE HCL 50 MG PO TABS
50 | ORAL_TABLET | Freq: Every evening | ORAL | 1 refills | Status: AC
Start: 2022-11-09 — End: ?

## 2022-11-09 NOTE — Progress Notes (Signed)
[facility]  [facility]  Danelle Earthly, M.D.  Internal Medicine  [address]  [place], [place] [address]  Office : [phone]  Fax : [phone]    Chief Complaint   Patient presents with    Depression     ROV     Discuss Medications     Wants to ask about a Epi-pen about his allergy to yellow jacket       History of Present Illness:  Phillip Steele is a 62 y.o. male.  HPI    Anxiety/Depression/Alcohol Abuse  Patient has attended 2 inpatient detox treatments previously.  One in [place] and one in [place].  He did better on medication but he does not have any follow up with psychiatry or addiction medication.  He is currently seeking employment.   He is going to [employer] here and [employer], [place] and remaining off of substances. Sober now.  Has a new job    Hymenoptera allergy  Has not had anaphylaxis    Past Medical History:  Past Medical History:   Diagnosis Date    Alcohol use 09/21/2017    Anxiety 09/21/2017     Past Surgical History:  Past Surgical History:   Procedure Laterality Date    CYST REMOVAL  ?2000    upper back     Allergies:   Allergies   Allergen Reactions    Chlordiazepoxide Other (See Comments)     "Made me depressed"    Escitalopram Other (See Comments)     SIDE EFFECTS     Medications:   Current Outpatient Medications   Medication Sig Dispense Refill    desvenlafaxine succinate (PRISTIQ) 50 MG TB24 extended release tablet Take 1 tablet by mouth daily 90 tablet 1    QUEtiapine (SEROQUEL) 25 MG tablet Take 1 tablet by mouth nightly 1 tabs po QHS 90 tablet 1    traZODone (DESYREL) 50 MG tablet Take 1 tablet by mouth nightly 90 tablet 1    gabapentin (NEURONTIN) 300 MG capsule Take 1 capsule by mouth at bedtime for 180 days. 90 capsule 1     No current facility-administered medications for this visit.     Social History:  Social History     Tobacco Use    Smoking status: Never    Smokeless tobacco: Never   Substance Use Topics    Alcohol use: Not Currently      Family History  Family History   Problem Relation Age of Onset    Thyroid Disease Sister     Other Father         unknown    Dementia Mother        Review of Systems   Constitutional:  Negative for chills, fatigue and fever.   Respiratory:  Negative for shortness of breath.    Cardiovascular:  Negative for chest pain.   Skin:  Negative for rash.   Neurological:  Negative for speech difficulty.   Psychiatric/Behavioral:  Negative for dysphoric mood, sleep disturbance and suicidal ideas. The patient is not nervous/anxious.          Vital Signs  BP 129/80 (Site: Left Upper Arm, Position: Sitting, Cuff Size: Large Adult)   Pulse 68   Temp 97.9 F (36.6 C) (Temporal)   Ht 1.753 m (5\' 9" )   Wt 93.9 kg (207 lb)   SpO2 99%   BMI 30.57 kg/m   Body mass index is 30.57 kg/m.    Physical  Exam  Vitals reviewed.   Constitutional:       General: He is not in acute distress.     Appearance: Normal appearance. He is not ill-appearing or toxic-appearing.   HENT:      Head: Normocephalic and atraumatic.   Eyes:      General: No scleral icterus.     Conjunctiva/sclera: Conjunctivae normal.   Neck:      Vascular: No carotid bruit.   Cardiovascular:      Rate and Rhythm: Normal rate and regular rhythm.      Heart sounds: Normal heart sounds. No murmur heard.  Pulmonary:      Effort: Pulmonary effort is normal.      Breath sounds: Normal breath sounds.   Musculoskeletal:         General: No swelling.   Skin:     Coloration: Skin is not jaundiced.      Findings: No rash.   Neurological:      General: No focal deficit present.      Mental Status: He is alert. Mental status is at baseline.      Cranial Nerves: No cranial nerve deficit.      Motor: No weakness.      Gait: Gait normal.   Psychiatric:         Mood and Affect: Mood normal.         Behavior: Behavior normal.         Thought Content: Thought content normal.         Judgment: Judgment normal.           Assessment/Plan:  Atom was seen today for depression and discuss  medications.    Diagnoses and all orders for this visit:    Dysthymia (or depressive neurosis)  -     desvenlafaxine succinate (PRISTIQ) 50 MG TB24 extended release tablet; Take 1 tablet by mouth daily    Primary insomnia  -     QUEtiapine (SEROQUEL) 25 MG tablet; Take 1 tablet by mouth nightly 1 tabs po QHS  -     traZODone (DESYREL) 50 MG tablet; Take 1 tablet by mouth nightly    Mood disorder (HCC)  -     gabapentin (NEURONTIN) 300 MG capsule; Take 1 capsule by mouth at bedtime for 180 days.        Return in about 6 months (around 05/12/2023), or if symptoms worsen or fail to improve.  __  Margrett Rud, M.D.

## 2023-06-01 ENCOUNTER — Encounter

## 2023-07-11 NOTE — Telephone Encounter (Signed)
 Patient called to request refills on all 4 of his medications.  Refill requests have been refused on 06-01-23.  Patient was last seen on 11-09-22, was supposed to return in February.  Phillip Steele has moved to [place] months ago, but has not yet looked for a new PCP.  Tentative appointment has been scheduled for Thursday 07-19-23, he has an aunt that may pass away, so he may be in town.  Phillip Steele was also advised to schedule an appointment with a new PCP.

## 2023-07-19 ENCOUNTER — Encounter: Attending: Internal Medicine | Primary: Internal Medicine

## 2023-07-30 ENCOUNTER — Ambulatory Visit
Admit: 2023-07-30 | Discharge: 2023-07-30 | Payer: PRIVATE HEALTH INSURANCE | Attending: Internal Medicine | Primary: Internal Medicine

## 2023-07-30 ENCOUNTER — Encounter

## 2023-07-30 DIAGNOSIS — Z1211 Encounter for screening for malignant neoplasm of colon: Secondary | ICD-10-CM

## 2023-07-30 DIAGNOSIS — F341 Dysthymic disorder: Secondary | ICD-10-CM

## 2023-07-30 LAB — CBC
Hematocrit: 42.1 % (ref 38.0–52.0)
Hemoglobin: 14.2 g/dL (ref 13.0–17.3)
MCH: 30.7 pg (ref 27.0–34.5)
MCHC: 33.7 g/dL (ref 30.0–36.0)
MCV: 91.1 fL (ref 84.0–100.0)
MPV: 10.3 fL (ref 7.0–12.2)
NRBC Absolute: 0 10*3/uL (ref 0.000–0.012)
NRBC Automated: 0 % (ref 0.0–0.2)
Platelets: 254 10*3/uL (ref 140–440)
RBC: 4.62 x10e6/mcL (ref 4.00–5.60)
RDW: 13.2 % (ref 10.0–17.0)
WBC: 5.1 10*3/uL (ref 3.8–10.6)

## 2023-07-30 LAB — COMPREHENSIVE METABOLIC PANEL
ALT: 19 U/L (ref 0–42)
AST: 27 U/L (ref 0–46)
Albumin/Globulin Ratio: 1.9 (ref 1.00–2.70)
Albumin: 4.4 g/dL (ref 3.5–5.2)
Alk Phosphatase: 67 U/L (ref 40–130)
Anion Gap: 9 mmol/L (ref 2–17)
BUN: 22 mg/dL (ref 8–23)
CO2: 24 mmol/L (ref 22–29)
Calcium: 9.5 mg/dL (ref 8.5–10.7)
Chloride: 109 mmol/L — ABNORMAL HIGH (ref 98–107)
Creatinine: 0.9 mg/dL (ref 0.7–1.3)
Est, Glom Filt Rate: 97 mL/min/1.73mÂ² (ref >=60)
Globulin: 2.3 g/dL (ref 1.9–4.4)
Glucose: 101 mg/dL — ABNORMAL HIGH (ref 70–99)
Osmolaliy Calculated: 286 mosm/kg (ref 270–287)
Potassium: 4.7 mmol/L (ref 3.5–5.3)
Sodium: 142 mmol/L (ref 135–145)
Total Bilirubin: 0.45 mg/dL (ref 0.00–1.20)
Total Protein: 6.7 g/dL (ref 5.7–8.3)

## 2023-07-30 LAB — LIPID PANEL
Chol/HDL Ratio: 3.8 (ref 0.0–4.4)
Cholesterol, Total: 166 mg/dL (ref 100–200)
HDL: 44 mg/dL (ref >=40)
LDL Cholesterol: 109 mg/dL — ABNORMAL HIGH (ref 0.0–100.0)
LDL/HDL Ratio: 2.5
Triglycerides: 65 mg/dL (ref 0–149)
VLDL: 13 mg/dL (ref 5.0–40.0)

## 2023-07-30 LAB — HEMOGLOBIN A1C
Estimated Avg Glucose: 108
Estimated Avg Glucose: 115
Hemoglobin A1C: 5.4 % (ref 4.0–6.0)

## 2023-07-30 LAB — PSA, DIAGNOSTIC: PSA: 6.58 ng/mL — ABNORMAL HIGH (ref 0.000–4.000)

## 2023-07-30 LAB — PSA, FREE
PSA, Free Pct: 9.9 %
PSA, Free: 0.65 ng/mL — ABNORMAL HIGH (ref 0.00–0.50)
PSA: 6.58 ng/mL — ABNORMAL HIGH (ref 0.000–4.000)

## 2023-07-30 LAB — VITAMIN D 25 HYDROXY: Vit D, 25-Hydroxy: 27.4 ng/mL — ABNORMAL LOW (ref 30.0–90.0)

## 2023-07-30 LAB — TSH REFLEX TO FT4: TSH: 1.78 u[IU]/mL (ref 0.358–3.740)

## 2023-07-30 LAB — VITAMIN B12: Vitamin B-12: 818 pg/mL (ref 232–1245)

## 2023-07-30 LAB — HIV SCREEN: HIV Screen: NEGATIVE

## 2023-07-30 LAB — HEPATITIS C ANTIBODY: Hepatitis C Ab: NEGATIVE

## 2023-07-30 MED ORDER — DESVENLAFAXINE SUCCINATE ER 50 MG PO TB24
50 | ORAL_TABLET | Freq: Every day | ORAL | 1 refills | 90.00000 days | Status: DC
Start: 2023-07-30 — End: 2024-02-04

## 2023-07-30 MED ORDER — GABAPENTIN 100 MG PO CAPS
100 | ORAL_CAPSULE | Freq: Every evening | ORAL | 0 refills | 30.00000 days | Status: DC
Start: 2023-07-30 — End: 2023-10-29

## 2023-07-30 MED ORDER — SHINGRIX 50 MCG/0.5ML IM SUSR
50 | INTRAMUSCULAR | 1 refills | 1.00000 days | Status: DC
Start: 2023-07-30 — End: 2023-10-30

## 2023-07-30 MED ORDER — TRAZODONE HCL 50 MG PO TABS
50 | ORAL_TABLET | Freq: Every evening | ORAL | 1 refills | 30.00000 days | Status: DC
Start: 2023-07-30 — End: 2024-02-04

## 2023-07-30 NOTE — Progress Notes (Signed)
 Chief Complaint  Chief Complaint   Patient presents with    New Patient     Here to become established. Previously seeing Dr. Budelmann in [place], [place].    Discuss Medications       HISTORY OF PRESENT ILLNESS  History of Present Illness  The patient presents for evaluation of medication management, elevated PSA, and health maintenance.    The chief complaint includes managing his current medication regimen and addressing elevated PSA levels. He has been on quetiapine , gabapentin , and trazodone  for the past 33 months, which has improved mood stability and overall happiness. He is currently tapering off alcohol and maintains an active lifestyle, engaging in daily exercise such as running, weightlifting, and cycling. No neuropathic pain or other medical conditions are reported. Joint discomfort is attributed to aging and rigorous exercise. An active lifestyle and adherence to his treatment program are believed to have prevented anxiety attacks.     No recent blood work has been done. Sugar cravings have intensified despite initial management through exercise and weight loss. He consumes three energy drinks daily, which he finds beneficial for his digestive system. Sexual activity has resumed, and a multivitamin powder mixed with spinach, apples, eggs, blueberries, and oranges is taken.    A referral to a urologist for elevated PSA levels has not been followed up. The shingles vaccine has not been received.    SOCIAL HISTORY  The patient is in recovery from alcohol use.    Assessment & Plan    Assessment & Plan  1. Medication management.  - Gabapentin  300 mg nightly will be continued, with a gradual reduction of 100 mg per week as tolerated.  - Trazodone  will be maintained for now, but will be discontinued following the cessation of gabapentin  to assess his response.  - Desvenlafaxine  will be continued as prescribed.  - A prescription for quetiapine  100 mg tablets to take 3 at a time for now was provided.    2.  Elevated PSA.  - PSA levels have been consistently elevated, with a peak of 10.8.  - The fluctuating nature of these levels suggests a lower likelihood of prostate cancer, but further evaluation by a urologist is recommended.  - A repeat PSA test will be conducted today. If the results are normal, no further action will be required. However, if the levels remain high, a referral to a urologist will be necessary.    3. Health maintenance.  - Advised to receive the shingles vaccine, which can be administered at any pharmacy.  - Fasting blood work will be completed today.    Follow-up  - Follow up in 3 months for a physical examination.     1. Dysthymia (or depressive neurosis)  Assessment & Plan:  Longstanding utilization of venlafaxine/Pristiq .  He would like to stop his Neurontin  and trazodone  as tolerated.  We are to wean off the gabapentin /Neurontin  first.    Orders:  -     desvenlafaxine  succinate (PRISTIQ ) 50 MG TB24 extended release tablet; Take 1 tablet by mouth daily, Disp-90 tablet, R-1Normal  2. Mood disorder  -     gabapentin  (NEURONTIN ) 100 MG capsule; Take 3 capsules by mouth at bedtime for 90 days., Disp-270 capsule, R-0Normal  3. Primary insomnia  Assessment & Plan:  Persistent insomnia.  Continuing trazodone  for now.    Orders:  -     traZODone  (DESYREL ) 50 MG tablet; Take 1 tablet by mouth nightly, Disp-90 tablet, R-1Normal  4. Elevated PSA  Assessment & Plan:  PSA remains elevated.  Free PSA ratio elevated concerning for underlying cancer referral made to urology-Dr. Maryln Sober  Orders:  -     PSA, Diagnostic; Future  -     PSA, Free; Future  5. 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  He is been sober now for approximately 3 years.    6. Vitamin D deficiency  Assessment & Plan:  Documented low vitamin D levels supplement with vitamin D 2000 units daily   Orders:  -     Vitamin D 25 Hydroxy; Future  7. Colon cancer screening  -      Cologuard (Fecal DNA Colorectal Cancer Screening)  8. Screening PSA (prostate specific antigen)  9. Screening for HIV (human immunodeficiency virus)  -     HIV Screen; Future  10. Need for hepatitis C screening test  -     Hepatitis C Antibody; Future  11. Routine general medical examination at a health care facility  -     CBC; Future  -     Comprehensive Metabolic Panel; Future  -     TSH reflex to FT4; Future  -     Lipid Panel; Future  -     Hemoglobin A1C; Future  12. Hyperglycemia  -     Hemoglobin A1C; Future  13. Vitamin B12 deficiency  -     Vitamin B12; Future     Needs referral to urology-Dr. Burnis [facility]ver [facility]  Lipid profile stable  CMP: Within normal limits with normal liver enzymes electrolytes and kidney function  Free PSA 0.65 with free PSA percentage of 9.9 and a total PSA of 6.5.  We need to refer you to urology for further evaluation  Vitamin D levels low -recommend vitamin D supplementation 2000 units daily  CBC: Within normal limits  B12 levels normal  Thyroid within normal limits  Screening for hepatitis C and HIV negative  A1c normal at 5.4 screening for diabetes          07/30/2023     8:40 AM 11/09/2022     2:19 PM 09/19/2021     3:38 PM 02/11/2021    10:27 AM 11/04/2020     2:46 PM 09/22/2020     4:14 PM 06/26/2019     1:35 PM   PHQ Scores   PHQ2 Score 0 0 0 0 0 0 0   PHQ2 Score       0   PHQ9 Score 2 0 0 0 0 0 0     Interpretation of Total Score Depression Severity: 1-4 = Minimal depression, 5-9 = Mild depression, 10-14 = Moderate depression, 15-19 = Moderately severe depression, 20-27 = Severe depression   The diagnoses and plan were discussed with the patient, who verbalizes understanding and agrees with plan.  All questions answered.    I spent at least 62 minutes on this visit with this new patient.  Patient Instructions   Stop Seroquel /quetiapine   Continue gabapentin  300 mg nightly you can start reducing it every week by 100 mg as tolerated  Continue trazodone  for now but after you discontinue  the gabapentin  you can stop that as well to see how you do.  Continue Pristiq /desvenlafaxine  as ordered.  Lets have you return in 3 months for physical  Complete your blood work fasting    Active medications at end of Visit  Current Outpatient Medications   Medication Sig Dispense Refill    zoster recombinant  adjuvanted vaccine [facility]) 50 MCG/0.5ML SUSR injection Inject 0.5 mLs into the muscle See Admin Instructions 1 dose now and repeat in 2-6 months 0.5 mL 1    desvenlafaxine  succinate (PRISTIQ ) 50 MG TB24 extended release tablet Take 1 tablet by mouth daily 90 tablet 1    traZODone  (DESYREL ) 50 MG tablet Take 1 tablet by mouth nightly 90 tablet 1    gabapentin  (NEURONTIN ) 100 MG capsule Take 3 capsules by mouth at bedtime for 90 days. 270 capsule 0     No current facility-administered medications for this visit.          Return in about 3 months (around 10/29/2023) for CPE.       REVIEW OF SYSTEMS  Review of Systems     PHYSICAL EXAMINATION  Vitals:    07/30/23 0837   BP: 124/80   BP Site: Left Upper Arm   Patient Position: Sitting   BP Cuff Size: Large Adult   Pulse: 67   SpO2: 97%   Weight: 94.8 kg (209 lb)   Height: 1.753 m (5\' 9" )     Body mass index is 30.86 kg/m.    Physical Exam  Constitutional:       Appearance: Normal appearance. He is normal weight.   HENT:      Head: Normocephalic and atraumatic.      Right Ear: External ear normal.      Left Ear: External ear normal.      Nose: Nose normal.      Mouth/Throat:      Mouth: Mucous membranes are moist.      Pharynx: Oropharynx is clear.   Eyes:      Conjunctiva/sclera: Conjunctivae normal.      Pupils: Pupils are equal, round, and reactive to light.   Cardiovascular:      Rate and Rhythm: Normal rate and regular rhythm.      Pulses: Normal pulses.      Heart sounds: Normal heart sounds.   Pulmonary:      Effort: Pulmonary effort is normal.      Breath sounds: Normal breath sounds.   Abdominal:      General: Bowel sounds are normal.      Palpations:  Abdomen is soft.   Musculoskeletal:         General: Normal range of motion.      Cervical back: Normal range of motion and neck supple.   Skin:     General: Skin is warm and dry.      Capillary Refill: Capillary refill takes less than 2 seconds.   Neurological:      General: No focal deficit present.      Mental Status: He is alert and oriented to person, place, and time. Mental status is at baseline.   Psychiatric:         Mood and Affect: Mood normal.         Behavior: Behavior normal.         Thought Content: Thought content normal.         Judgment: Judgment normal.       Physical Exam  Respiratory: Clear to auscultation, no wheezing, rales or rhonchi  Cardiovascular: Regular rate and rhythm, no murmurs, rubs, or gallops    MEDICATIONS at [facility] of Visit  No current outpatient medications on file prior to visit.     No current facility-administered medications on file prior to visit.       ALLERGIES / INTOLERANCES  Allergies  Allergen Reactions    Chlordiazepoxide Other (See Comments)     "Made me depressed"    Escitalopram Other (See Comments)     SIDE EFFECTS       PERTINENT LABS AND IMAGING  Results  Labs   - PSA: 04/24/2022, 9      PAST MEDICAL HISTORY  Past Medical History:   Diagnosis Date    Alcohol use 09/21/2017    Anxiety 09/21/2017       PAST SURGICAL HISTORY  Past Surgical History:   Procedure Laterality Date    CYST REMOVAL  ?2000    upper back       FAMILY HISTORY  Family History   Problem Relation Age of Onset    Thyroid Disease Sister     Other Father         unknown    Dementia Mother        SOCIAL HISTORY  Social History     Socioeconomic History    Marital status: Single     Spouse name: None    Number of children: None    Years of education: None    Highest education level: None   Tobacco Use    Smoking status: Never    Smokeless tobacco: Never   Vaping Use    Vaping status: Never Used   Substance and Sexual Activity    Alcohol use: Not Currently    Drug use: Not Currently   Social History  Narrative    Divorced - lives alone       Social Drivers of Health     Financial Resource Strain: Low Risk  (11/09/2022)    Overall Financial Resource Strain (CARDIA)     Difficulty of Paying Living Expenses: Not hard at all   Food Insecurity: No Food Insecurity (11/09/2022)    Hunger Vital Sign     Worried About Running Out of Food in the Last Year: Never true     Ran Out of Food in the Last Year: Never true   Transportation Needs: Unknown (11/09/2022)    PRAPARE - Transportation     Lack of Transportation (Non-Medical): No   Housing Stability: Unknown (11/09/2022)    Housing Stability Vital Sign     Unstable Housing in the Last Year: No       The patient (or guardian, if applicable) and other individuals in attendance with the patient were advised that Artificial Intelligence will be utilized during this visit to record, process the conversation to generate a clinical note, and support improvement of the AI technology. The patient (or guardian, if applicable) and other individuals in attendance at the appointment consented to the use of AI, including the recording.

## 2023-07-30 NOTE — Patient Instructions (Signed)
 Stop Seroquel /quetiapine   Continue gabapentin  300 mg nightly you can start reducing it every week by 100 mg as tolerated  Continue trazodone  for now but after you discontinue the gabapentin  you can stop that as well to see how you do.  Continue Pristiq /desvenlafaxine  as ordered.  Lets have you return in 3 months for physical  Complete your blood work fasting

## 2023-08-01 ENCOUNTER — Encounter: Attending: Internal Medicine | Primary: Internal Medicine

## 2023-08-02 NOTE — Other (Signed)
 Needs referral to urology-Dr. Burnis [facility]ver [facility]  Lipid profile stable  CMP: Within normal limits with normal liver enzymes electrolytes and kidney function  Free PSA 0.65 with free PSA percentage of 9.9 and a total PSA of 6.5.  We need to refer you to urology for further evaluation  Vitamin D levels low -recommend vitamin D supplementation 2000 units daily  CBC: Within normal limits  B12 levels normal  Thyroid within normal limits  Screening for hepatitis C and HIV negative  A1c normal at 5.4 screening for diabetes

## 2023-08-03 ENCOUNTER — Encounter

## 2023-08-03 NOTE — Other (Signed)
 Referral placed for Dr. Maryln Sober.

## 2023-08-08 DIAGNOSIS — F341 Dysthymic disorder: Secondary | ICD-10-CM

## 2023-08-08 NOTE — Assessment & Plan Note (Signed)
 Documented low vitamin D levels supplement with vitamin D 2000 units daily

## 2023-08-08 NOTE — Assessment & Plan Note (Signed)
 Longstanding utilization of venlafaxine/Pristiq .  He would like to stop his Neurontin  and trazodone  as tolerated.  We are to wean off the gabapentin /Neurontin  first.

## 2023-08-08 NOTE — Assessment & Plan Note (Signed)
 PSA remains elevated.  Free PSA ratio elevated concerning for underlying cancer referral made to urology-Dr. Maryln Sober

## 2023-08-08 NOTE — Assessment & Plan Note (Signed)
 Persistent insomnia.  Continuing trazodone  for now.

## 2023-08-08 NOTE — Assessment & Plan Note (Signed)
 Patient remains recovery and is doing well.  He is been sober now for approximately 3 years.

## 2023-08-13 NOTE — Telephone Encounter (Signed)
 Pt states that he has a problem getting his My Chart activated so I confirmed Pt's User name & it was correct & told Pt to go to change my password & it should direct Pt to go in to set up a new password    Pt states that he will try this       No Further Info Needed

## 2023-08-17 LAB — FECAL DNA COLORECTAL CANCER SCREENING (COLOGUARD): FIT-DNA (Cologuard): NEGATIVE

## 2023-08-17 NOTE — Other (Signed)
 Cologuard negative. Repeat in 3 years.

## 2023-10-29 ENCOUNTER — Ambulatory Visit: Admit: 2023-10-29 | Discharge: 2023-10-29 | Attending: Internal Medicine | Primary: Internal Medicine

## 2023-10-29 VITALS — BP 113/71 | HR 61 | Temp 98.30000°F | Wt 203.8 lb

## 2023-10-29 DIAGNOSIS — Z0000 Encounter for general adult medical examination without abnormal findings: Principal | ICD-10-CM

## 2023-10-29 NOTE — Patient Instructions (Signed)
 Well Visit, Ages 88 to 65: Care Instructions  Well visits can help you stay healthy. Your doctor has checked your overall health and may have suggested ways to take good care of yourself. Your doctor also may have recommended tests. You can help prevent illness with healthy eating, good sleep, vaccinations, regular exercise, and other steps.    Get the tests that you and your doctor decide on. Depending on your age and risks, examples might include screening for diabetes; hepatitis C; HIV; and cervical, breast, lung, and colon cancer. Screening helps find diseases before any symptoms appear.   Eat healthy foods. Choose fruits, vegetables, whole grains, lean protein, and low-fat dairy foods. Limit saturated fat and reduce salt.     Limit alcohol. Men should have no more than 2 drinks a day. Women should have no more than 1. For some people, no alcohol is the best choice.   Exercise. Get at least 30 minutes of exercise on most days of the week. Walking can be a good choice.     Reach and stay at your healthy weight. This will lower your risk for many health problems.   Take care of your mental health. Try to stay connected with friends, family, and community, and find ways to manage stress.     If you're feeling depressed or hopeless, talk to someone. A counselor can help. If you don't have a counselor, talk to your doctor.   Talk to your doctor if you think you may have a problem with alcohol or drug use. This includes prescription medicines, marijuana, and other drugs.     Avoid tobacco and nicotine: Don't smoke, vape, or chew. If you need help quitting, talk to your doctor.   Practice safer sex. Getting tested, using condoms or dental dams, and limiting sex partners can help prevent STIs.     Use birth control if it's important to you to prevent pregnancy. Talk with your doctor about your choices and what might be best for you.   Prevent problems where you can. Protect your skin from too much sun, wash your  hands, brush your teeth twice a day, and wear a seat belt in the car.   Where can you learn more?  Go to [employer] and enter P072 to learn more about "Well Visit, Ages 50 to 11: Care Instructions."  Current as of: August 08, 2022  Content Version: 14.5   2024-2025 [place], [place].   Care instructions adapted under license by [facility]. If you have questions about a medical condition or this instruction, always ask your healthcare professional. Laren Player, [facility], disclaims any warranty or liability for your use of this information.

## 2023-10-29 NOTE — Progress Notes (Signed)
 10/29/2023  Chief Complaint   Patient presents with    Annual Exam    Discuss Medications       Phillip Steele (DOB:  [birth year]) is a63 y.o. male, here for a preventive medicine evaluation.  History of Present Illness  The patient presents for evaluation of elevated PSA and medication management.    Elevated PSA  - He reports frequent urination and is concerned about his prostate health.  - His PSA levels have fluctuated, with a decrease to 6 or 7 after treatment, but a subsequent increase to 9.2 in 04/2022.  - A recent test three months ago showed a decrease to 6.5.  - He has undergone a Cologuard test and has an upcoming appointment with a urologist in [facility] in 01/2024.    Medication Management  - He has been on venlafaxine for some time, taking one dose in the morning and two at night.  - He finds that the morning dose helps maintain a stable mood.  - He had previously discontinued this medication but experienced a depressive episode and does not wish to repeat this.  - He was also taking gabapentin , which he has since stopped due to joint and muscle pain.  - He was using trazodone  to aid sleep but has discontinued it due to severe joint pain.  - He is requesting a refill of his trazodone  prescription to help manage his sleep over the next month.    He has been making lifestyle changes, including dietary modifications, weight loss, and increased physical activity.    His diet now includes more vegetables and fresh produce.    He has also changed his employment situation, which he believes was contributing to his stress levels.    He is currently unemployed and without insurance, so he is paying for his healthcare out of pocket.    He has been attending [employer] and coed meetings and is considering getting a new sponsor.    He is also trying yoga.    He has designated [employer] as his healthcare power of attorney and is in the process of creating a living will.    He reports feeling  healthier than he has in the past 25 to 30 years.    He is taking magnesium supplements and has incorporated more spinach, blueberries, strawberries, watermelon, and cottage cheese into his diet.    Social History:  Occupations: Human resources officer in [employer]  Hobbies: Attending [employer], code meetings, yoga  Diet: Vegetables, fresh produce, spinach, blueberries, strawberries, watermelon, cottage cheese  Living Condition: Recently moved from [place] to [facility]      ASSESSMENT/PLAN:  Assessment & Plan  1. Elevated PSA  - PSA levels have shown a decreasing trend, suggesting a lower likelihood of prostate cancer  - Most recent PSA level: 6.5  - Order free PSA test  - Consult with urologist regarding elevated PSA levels    2. Medication management  - Venlafaxine: Continue one dose in the morning and two at night for stable mood  - Gabapentin : Discontinued due to joint and muscle pain  - Trazodone : Continue 50 mg tablets as needed for sleep, discontinue when ready, refill provided if requested    3. Health maintenance  - Vitamin D levels slightly low but not concerning  - Monitor annually    1. Encounter for well adult exam without abnormal findings  2. Elevated PSA  Assessment & Plan:  PSA is elevated but improving.  He is going to get a repeat PSA.  He lacks insurance other than hide deductible plan.  If PSA is improved we can hold off on seeing [facility] until he reestablishes the insurance.  We discussed the risk of prostate cancer PSA levels can correlate to prostate cancer however with PSAs improving it makes it less likely.  Orders:  -     PSA, Free; Future  -     PSA, Diagnostic; Future  3. Anxiety  Overview:  Controlled-stable  Pristiq  50 mg daily  Assessment & Plan:  Overall improved.  He would like to continue his Pristiq .    4. Alcohol dependence in remission [facility])  Assessment & Plan:  Patient remains well involved in the recovery.  He has been sober now greater than 3  years.  He weaned himself off of gabapentin  he was having joint muscle pain  He is planning to wean himself off the trazodone  as well.    5. Primary insomnia  Assessment & Plan:  States that his sleep is overall better.  He is going to try and wean off the trazodone .    6. Dysthymia (or depressive neurosis)  Overview:  Controlled-stable would like to continue Pristiq .  Pristiq  50 mg daily  Assessment & Plan:  He weaned himself off the gabapentin .  He is good to continue to use trazodone  for sleep but plans on weaning himself off that as well.         Return in 1 year (on 10/28/2024) for CPE (Physical Exam).    CardioVasc Risk  The 10-year ASCVD risk score (Arnett DK, et al., 2019) is: 7%    Values used to calculate the score:      Age: 63 years      Sex: Male      Is Non-Hispanic African American: No      Diabetic: No      Tobacco smoker: No      Systolic Blood Pressure: 113 mmHg      Is BP treated: No      HDL Cholesterol: 44 mg/dL      Total Cholesterol: 166 mg/dL    Advance care planning  Advance Care Planning   HCPOA:Dayna Pelle friend [phone]  Living Will: Yes  Code Status: Full Code       Health maintenance: Male   Colonoscopy:  cologuard - [facility]  Prostate screening:  monitoring - referred to [facility]    Tetanus vaccination(every 10 years): Updated September 21, 2017  Pneumonia vaccination(either Prevnar 20/21 or Both PCV 13 and PPSV23): Due at age 63 Prevnar 20  Shingles vaccination (Shingrix  x 2): Recommended we will hold off until he gets insurance again  Flu vaccination: Intermittently  RSV vaccination: Routine    Review of Systems   Constitutional:  Negative for appetite change, chills, fever and unexpected weight change.   HENT:  Negative for hearing loss and sore throat.    Eyes:  Negative for pain, discharge and visual disturbance.   Respiratory:  Negative for cough and shortness of breath.    Cardiovascular:  Negative for chest pain and palpitations.   Gastrointestinal:  Negative for abdominal pain,  diarrhea, nausea and vomiting.   Endocrine: Negative for cold intolerance, heat intolerance, polydipsia and polyuria.   Genitourinary:  Negative for difficulty urinating, dysuria, frequency and hematuria.   Musculoskeletal:  Negative for arthralgias.   Skin:  Negative for rash.   Neurological:  Negative for dizziness, syncope and weakness.   Hematological:  Negative for  adenopathy.       Prior to Visit Medications    Medication Sig Taking? Authorizing Provider   desvenlafaxine  succinate (PRISTIQ ) 50 MG TB24 extended release tablet Take 1 tablet by mouth daily Yes Graceland Wachter, Maryla GAILS, MD   traZODone  (DESYREL ) 50 MG tablet Take 1 tablet by mouth nightly Yes Caleen Maryla GAILS, MD        Allergies   Allergen Reactions    Chlordiazepoxide Other (See Comments)     Made me depressed    Escitalopram Other (See Comments)     SIDE EFFECTS       Past Medical History:   Diagnosis Date    Alcohol use 09/21/2017    Anxiety 09/21/2017       Past Surgical History:   Procedure Laterality Date    CYST REMOVAL  ?2000    upper back       Social History     Socioeconomic History    Marital status: Single     Spouse name: Not on file    Number of children: Not on file    Years of education: Not on file    Highest education level: Not on file   Occupational History    Not on file   Tobacco Use    Smoking status: Never    Smokeless tobacco: Never   Vaping Use    Vaping status: Never Used   Substance and Sexual Activity    Alcohol use: Not Currently    Drug use: Not Currently    Sexual activity: Not on file   Other Topics Concern    Not on file   Social History Narrative    Divorced - lives alone       Social Drivers of Health     Financial Resource Strain: Low Risk  (11/09/2022)    Overall Financial Resource Strain (CARDIA)     Difficulty of Paying Living Expenses: Not hard at all   Food Insecurity: No Food Insecurity (11/09/2022)    Hunger Vital Sign     Worried About Running Out of Food in the Last Year: Never true     Ran Out of Food in the Last Year:  Never true   Transportation Needs: Unknown (11/09/2022)    PRAPARE - Therapist, art (Medical): Not on file     Lack of Transportation (Non-Medical): No   Physical Activity: Not on file   Stress: Not on file   Social Connections: Not on file   Intimate Partner Violence: Not on file   Housing Stability: Unknown (11/09/2022)    Housing Stability Vital Sign     Unable to Pay for Housing in the Last Year: Not on file     Number of Places Lived in the Last Year: Not on file     Unstable Housing in the Last Year: No        Family History   Problem Relation Age of Onset    Thyroid Disease Sister     Other Father         unknown    Dementia Mother        ADVANCE DIRECTIVE: N, <no information>    Vitals:    10/29/23 1150   BP: 113/71   Pulse: 61   Temp: 98.3 F (36.8 C)   SpO2: 97%   Weight: 92.4 kg (203 lb 12.8 oz)     Estimated body mass index is 30.1 kg/m as calculated from the  following:    Height as of 07/30/23: 1.753 m (5' 9).    Weight as of this encounter: 92.4 kg (203 lb 12.8 oz).  Physical Exam  Constitutional:       Appearance: Normal appearance. He is normal weight.   HENT:      Head: Normocephalic and atraumatic.      Right Ear: Tympanic membrane and external ear normal.      Left Ear: Tympanic membrane and external ear normal.      Nose: Nose normal.      Mouth/Throat:      Mouth: Mucous membranes are moist.      Pharynx: Oropharynx is clear.   Eyes:      Extraocular Movements: Extraocular movements intact.      Conjunctiva/sclera: Conjunctivae normal.      Pupils: Pupils are equal, round, and reactive to light.   Cardiovascular:      Rate and Rhythm: Normal rate and regular rhythm.      Pulses: Normal pulses.           Dorsalis pedis pulses are 2+ on the right side and 2+ on the left side.        Posterior tibial pulses are 2+ on the right side and 2+ on the left side.      Heart sounds: Normal heart sounds.   Pulmonary:      Effort: Pulmonary effort is normal. No respiratory distress.       Breath sounds: Normal breath sounds. No stridor.   Abdominal:      General: Abdomen is flat. Bowel sounds are normal. There is no distension.      Palpations: Abdomen is soft. There is no mass.      Tenderness: There is no abdominal tenderness.   Musculoskeletal:         General: Normal range of motion.      Cervical back: Normal range of motion and neck supple.      Right lower leg: No edema.      Left lower leg: No edema.   Feet:      Right foot:      Skin integrity: Skin integrity normal.      Toenail Condition: Right toenails are normal.      Left foot:      Skin integrity: Skin integrity normal.      Toenail Condition: Left toenails are normal.   Skin:     General: Skin is warm and dry.      Capillary Refill: Capillary refill takes less than 2 seconds.   Neurological:      General: No focal deficit present.      Mental Status: He is alert and oriented to person, place, and time. Mental status is at baseline.      Cranial Nerves: No cranial nerve deficit.      Sensory: No sensory deficit.      Motor: No weakness.      Coordination: Coordination normal.      Deep Tendon Reflexes: Reflexes normal.   Psychiatric:         Mood and Affect: Mood normal.         Behavior: Behavior normal.         Thought Content: Thought content normal.         Judgment: Judgment normal.       Physical Exam  - Neurological: Cranial nerves II-XII intact  - Eyes: Extraocular movements intact  - Respiratory: Clear to auscultation, no  wheezing, rales, or rhonchi  - Cardiovascular: Regular rate and rhythm, no murmurs, rubs, or gallops  - Extremities: No swelling or edema noted in feet        Latest Ref Rng & Units 07/30/2023     9:30 AM 04/20/2022    11:59 AM 10/21/2020    10:25 PM   LAB PRIMARY CARE   A1C 4.0 - 6.0 % 5.4      A1C POC 4.0 - 6.0 % 5.4      GLU random 70 - 99 mg/dL 898  83  883    CHOL 899 - 200 mg/dL 833      TRIG 0 - 850 mg/dL 65      HDL >=59 mg/dL 44      LDL CALC 0.0 - 100.0 mg/dL 890.9      NA 864 - 854 mmol/L 142   139  134    K 3.5 - 5.3 mmol/L 4.7  5.0  4.2    BUN 8 - 23 mg/dL 22  19  8     CR 0.7 - 1.3 mg/dL 0.9  8.99  8.99    GFR >=60 mL/min/1.17m 97  >60     CA 8.5 - 10.7 mg/dL 9.5  9.5  9.1    ALT 0 - 42 unit/L 19  27  451    AST 0 - 46 unit/L 27  25  161    TSH 0.358 - 3.740 mcIU/mL 1.780      PSA 0.000 - 4.000 ng/mL  0.000 - 4.000 ng/mL 6.580     6.580  9.2     HGB 13.0 - 17.3 g/dL 85.7   84.1        Lab Results   Component Value Date/Time    CHOL 166 07/30/2023 09:30 AM    CHOL 164 06/05/2019 04:02 PM    TRIG 65 07/30/2023 09:30 AM    TRIG 65 06/05/2019 04:02 PM    HDL 44 07/30/2023 09:30 AM    HDL 56 06/05/2019 04:02 PM    GLUCOSE 101 07/30/2023 09:30 AM    LABA1C 5.4 07/30/2023 09:30 AM     Results  - Labs:    - PSA level: 10.8, then decreased to 5.4, increased to 9.2, and most recently decreased to 6.5    Immunization History   Administered Date(s) Administered    Influenza, FLUARIX, FLULAVAL, FLUZONE (age 61 mo+) and AFLURIA, (age 59 y+), Quadv PF, 0.16mL 01/21/2018    TDaP, ADACEL (age 32y-64y), BOOSTRIX (age 10y+), IM, 0.33mL 09/21/2017       Health Maintenance   Topic Date Due    Pneumococcal 50+ years Vaccine (1 of 2 - PCV) Never done    Shingles vaccine (1 of 2) Never done    COVID-19 Vaccine (1 - 2024-25 season) Never done    Flu vaccine (1) 11/09/2023    Prostate Specific Antigen (PSA) Screening or Monitoring  07/29/2024    Depression Monitoring  10/28/2024    Diabetes screen  07/30/2026    Colorectal Cancer Screen  08/10/2026    DTaP/Tdap/Td vaccine (2 - Td or Tdap) 09/22/2027    Lipids  07/29/2028    Respiratory Syncytial Virus (RSV) Pregnant or age 38 yrs+ (1 - 1-dose 75+ series) 05/22/2037    Hepatitis C screen  Completed    HIV screen  Completed    Hepatitis A vaccine  Aged Out    Hepatitis B vaccine  Aged Out    Hib vaccine  Aged  Out    Polio vaccine  Aged Out    Meningococcal (ACWY) vaccine  Aged Out    Meningococcal B vaccine  Aged Out    Depression Screen  Discontinued         An electronic signature  was used to authenticate this note.    --Maryla LULLA Dare, MD on 10/30/2023 at 11:16 PM      The patient (or guardian, if applicable) and other individuals in attendance with the patient were advised that Artificial Intelligence will be utilized during this visit to record, process the conversation to generate a clinical note, and support improvement of the AI technology. The patient (or guardian, if applicable) and other individuals in attendance at the appointment consented to the use of AI, including the recording.

## 2023-10-30 NOTE — Assessment & Plan Note (Signed)
 He weaned himself off the gabapentin .  He is good to continue to use trazodone  for sleep but plans on weaning himself off that as well.

## 2023-10-30 NOTE — Assessment & Plan Note (Addendum)
 PSA is elevated but improving.  He is going to get a repeat PSA.  He lacks insurance other than hide deductible plan.  If PSA is improved we can hold off on seeing [facility] until he reestablishes the insurance.  We discussed the risk of prostate cancer PSA levels can correlate to prostate cancer however with PSAs improving it makes it less likely.

## 2023-10-30 NOTE — Assessment & Plan Note (Signed)
 Overall improved.  He would like to continue his Pristiq .

## 2023-10-30 NOTE — Assessment & Plan Note (Signed)
 States that his sleep is overall better.  He is going to try and wean off the trazodone .

## 2023-10-30 NOTE — Assessment & Plan Note (Addendum)
 Patient remains well involved in the recovery.  He has been sober now greater than 3 years.  He weaned himself off of gabapentin  he was having joint muscle pain  He is planning to wean himself off the trazodone  as well.

## 2024-02-04 ENCOUNTER — Encounter

## 2024-02-04 MED ORDER — DESVENLAFAXINE SUCCINATE ER 50 MG PO TB24
50 | ORAL_TABLET | Freq: Every day | ORAL | 1 refills | 90.00000 days | Status: DC
Start: 2024-02-04 — End: 2024-05-05

## 2024-02-04 MED ORDER — TRAZODONE HCL 50 MG PO TABS
50 | ORAL_TABLET | Freq: Every evening | ORAL | 1 refills | 30.00000 days | Status: DC
Start: 2024-02-04 — End: 2024-05-05

## 2024-02-04 NOTE — Telephone Encounter (Signed)
"  Refill      Requested Prescriptions     Pending Prescriptions Disp Refills    traZODone  (DESYREL ) 50 MG tablet 90 tablet 1     Sig: Take 1 tablet by mouth nightly    desvenlafaxine  succinate (PRISTIQ ) 50 MG TB24 extended release tablet 90 tablet 1     Sig: Take 1 tablet by mouth daily         [facility] [phone]    [facility] to [facility]  "

## 2024-05-05 ENCOUNTER — Encounter

## 2024-05-05 MED ORDER — TRAZODONE HCL 50 MG PO TABS
50 | ORAL_TABLET | Freq: Every evening | ORAL | 1 refills | Status: AC
Start: 2024-05-05 — End: ?

## 2024-05-05 MED ORDER — DESVENLAFAXINE SUCCINATE ER 50 MG PO TB24
50 | ORAL_TABLET | Freq: Every day | ORAL | 1 refills | Status: AC
Start: 2024-05-05 — End: ?

## 2024-05-05 NOTE — Telephone Encounter (Signed)
 Requested Prescriptions     Pending Prescriptions Disp Refills    desvenlafaxine  succinate (PRISTIQ ) 50 MG TB24 extended release tablet 90 tablet 1     Sig: Take 1 tablet by mouth daily    traZODone  (DESYREL ) 50 MG tablet 90 tablet 1     Sig: Take 1 tablet by mouth nightly        [facility]    [phone]    [facility] to [facility]
